# Patient Record
Sex: Female | Born: 1965 | Race: Black or African American | Hispanic: No | Marital: Married | Smoking: Never smoker
Health system: Southern US, Community
[De-identification: ages and names within clinical notes are randomized; demographics above are authoritative.]

## PROBLEM LIST (undated history)

## (undated) DIAGNOSIS — D7589 Other specified diseases of blood and blood-forming organs: Secondary | ICD-10-CM

## (undated) DIAGNOSIS — F1010 Alcohol abuse, uncomplicated: Secondary | ICD-10-CM

## (undated) DIAGNOSIS — R011 Cardiac murmur, unspecified: Secondary | ICD-10-CM

## (undated) DIAGNOSIS — M1990 Unspecified osteoarthritis, unspecified site: Secondary | ICD-10-CM

## (undated) DIAGNOSIS — D259 Leiomyoma of uterus, unspecified: Secondary | ICD-10-CM

## (undated) DIAGNOSIS — O3410 Maternal care for benign tumor of corpus uteri, unspecified trimester: Secondary | ICD-10-CM

## (undated) DIAGNOSIS — F419 Anxiety disorder, unspecified: Secondary | ICD-10-CM

## (undated) DIAGNOSIS — I10 Essential (primary) hypertension: Secondary | ICD-10-CM

## (undated) DIAGNOSIS — K589 Irritable bowel syndrome without diarrhea: Secondary | ICD-10-CM

## (undated) DIAGNOSIS — K219 Gastro-esophageal reflux disease without esophagitis: Secondary | ICD-10-CM

## (undated) DIAGNOSIS — E039 Hypothyroidism, unspecified: Secondary | ICD-10-CM

## (undated) DIAGNOSIS — E785 Hyperlipidemia, unspecified: Secondary | ICD-10-CM

## (undated) HISTORY — DX: Essential (primary) hypertension: I10

## (undated) HISTORY — DX: Gastro-esophageal reflux disease without esophagitis: K21.9

## (undated) HISTORY — DX: Maternal care for benign tumor of corpus uteri, unspecified trimester: O34.10

## (undated) HISTORY — DX: Irritable bowel syndrome, unspecified: K58.9

## (undated) HISTORY — PX: BREAST EXCISIONAL BIOPSY: SUR124

## (undated) HISTORY — PX: CHOLECYSTECTOMY: SHX55

## (undated) HISTORY — DX: Alcohol abuse, uncomplicated: F10.10

## (undated) HISTORY — DX: Hyperlipidemia, unspecified: E78.5

## (undated) HISTORY — DX: Other specified diseases of blood and blood-forming organs: D75.89

## (undated) HISTORY — PX: TUBAL LIGATION: SHX77

## (undated) HISTORY — DX: Unspecified osteoarthritis, unspecified site: M19.90

## (undated) HISTORY — PX: COLONOSCOPY: SHX174

## (undated) HISTORY — DX: Cardiac murmur, unspecified: R01.1

## (undated) HISTORY — DX: Hypothyroidism, unspecified: E03.9

## (undated) HISTORY — DX: Leiomyoma of uterus, unspecified: D25.9

## (undated) HISTORY — DX: Anxiety disorder, unspecified: F41.9

---

## 1997-11-25 ENCOUNTER — Encounter: Admission: RE | Admit: 1997-11-25 | Discharge: 1997-11-25 | Payer: Self-pay | Admitting: Internal Medicine

## 1997-11-30 ENCOUNTER — Emergency Department (HOSPITAL_COMMUNITY): Admission: EM | Admit: 1997-11-30 | Discharge: 1997-11-30 | Payer: Self-pay | Admitting: Emergency Medicine

## 1997-12-25 ENCOUNTER — Encounter: Admission: RE | Admit: 1997-12-25 | Discharge: 1997-12-25 | Payer: Self-pay | Admitting: Hematology and Oncology

## 1998-04-25 ENCOUNTER — Encounter: Payer: Self-pay | Admitting: Emergency Medicine

## 1998-04-25 ENCOUNTER — Emergency Department (HOSPITAL_COMMUNITY): Admission: EM | Admit: 1998-04-25 | Discharge: 1998-04-25 | Payer: Self-pay | Admitting: Emergency Medicine

## 1998-05-22 ENCOUNTER — Encounter: Admission: RE | Admit: 1998-05-22 | Discharge: 1998-05-22 | Payer: Self-pay | Admitting: Obstetrics

## 1998-11-06 ENCOUNTER — Other Ambulatory Visit: Admission: RE | Admit: 1998-11-06 | Discharge: 1998-11-06 | Payer: Self-pay | Admitting: *Deleted

## 1998-11-06 ENCOUNTER — Encounter: Admission: RE | Admit: 1998-11-06 | Discharge: 1998-11-06 | Payer: Self-pay | Admitting: Obstetrics

## 1999-01-01 ENCOUNTER — Encounter: Admission: RE | Admit: 1999-01-01 | Discharge: 1999-01-01 | Payer: Self-pay | Admitting: Obstetrics

## 1999-04-16 ENCOUNTER — Encounter: Admission: RE | Admit: 1999-04-16 | Discharge: 1999-04-16 | Payer: Self-pay | Admitting: Obstetrics

## 1999-05-26 ENCOUNTER — Encounter (INDEPENDENT_AMBULATORY_CARE_PROVIDER_SITE_OTHER): Payer: Self-pay

## 1999-05-26 ENCOUNTER — Inpatient Hospital Stay (HOSPITAL_COMMUNITY): Admission: AD | Admit: 1999-05-26 | Discharge: 1999-05-26 | Payer: Self-pay | Admitting: *Deleted

## 1999-05-27 ENCOUNTER — Inpatient Hospital Stay (HOSPITAL_COMMUNITY): Admission: RE | Admit: 1999-05-27 | Discharge: 1999-05-27 | Payer: Self-pay | Admitting: *Deleted

## 1999-12-15 ENCOUNTER — Inpatient Hospital Stay (HOSPITAL_COMMUNITY): Admission: AD | Admit: 1999-12-15 | Discharge: 1999-12-15 | Payer: Self-pay | Admitting: *Deleted

## 2000-01-06 ENCOUNTER — Other Ambulatory Visit: Admission: RE | Admit: 2000-01-06 | Discharge: 2000-01-06 | Payer: Self-pay | Admitting: Obstetrics and Gynecology

## 2000-03-10 ENCOUNTER — Encounter: Payer: Self-pay | Admitting: Obstetrics and Gynecology

## 2000-03-10 ENCOUNTER — Encounter (INDEPENDENT_AMBULATORY_CARE_PROVIDER_SITE_OTHER): Payer: Self-pay | Admitting: Specialist

## 2000-03-10 ENCOUNTER — Inpatient Hospital Stay (HOSPITAL_COMMUNITY): Admission: AD | Admit: 2000-03-10 | Discharge: 2000-03-12 | Payer: Self-pay | Admitting: Obstetrics and Gynecology

## 2000-03-18 ENCOUNTER — Encounter: Payer: Self-pay | Admitting: Obstetrics and Gynecology

## 2000-03-18 ENCOUNTER — Inpatient Hospital Stay (HOSPITAL_COMMUNITY): Admission: AD | Admit: 2000-03-18 | Discharge: 2000-03-21 | Payer: Self-pay | Admitting: Obstetrics and Gynecology

## 2000-03-19 ENCOUNTER — Encounter: Payer: Self-pay | Admitting: Obstetrics and Gynecology

## 2000-08-01 ENCOUNTER — Other Ambulatory Visit: Admission: RE | Admit: 2000-08-01 | Discharge: 2000-08-01 | Payer: Self-pay | Admitting: Obstetrics and Gynecology

## 2000-09-09 ENCOUNTER — Observation Stay (HOSPITAL_COMMUNITY): Admission: RE | Admit: 2000-09-09 | Discharge: 2000-09-09 | Payer: Self-pay | Admitting: Obstetrics and Gynecology

## 2000-11-26 ENCOUNTER — Encounter (INDEPENDENT_AMBULATORY_CARE_PROVIDER_SITE_OTHER): Payer: Self-pay | Admitting: Specialist

## 2000-11-26 ENCOUNTER — Encounter: Payer: Self-pay | Admitting: Obstetrics and Gynecology

## 2000-11-26 ENCOUNTER — Inpatient Hospital Stay (HOSPITAL_COMMUNITY): Admission: AD | Admit: 2000-11-26 | Discharge: 2000-12-18 | Payer: Self-pay | Admitting: Obstetrics and Gynecology

## 2000-12-16 ENCOUNTER — Encounter: Payer: Self-pay | Admitting: Obstetrics and Gynecology

## 2001-02-09 ENCOUNTER — Ambulatory Visit (HOSPITAL_COMMUNITY): Admission: RE | Admit: 2001-02-09 | Discharge: 2001-02-09 | Payer: Self-pay | Admitting: Obstetrics and Gynecology

## 2001-08-07 ENCOUNTER — Encounter: Admission: RE | Admit: 2001-08-07 | Discharge: 2001-08-07 | Payer: Self-pay | Admitting: Obstetrics and Gynecology

## 2001-08-07 ENCOUNTER — Encounter: Payer: Self-pay | Admitting: Obstetrics and Gynecology

## 2001-11-10 ENCOUNTER — Other Ambulatory Visit: Admission: RE | Admit: 2001-11-10 | Discharge: 2001-11-10 | Payer: Self-pay | Admitting: Obstetrics and Gynecology

## 2002-07-16 ENCOUNTER — Encounter: Payer: Self-pay | Admitting: Emergency Medicine

## 2002-07-16 ENCOUNTER — Inpatient Hospital Stay (HOSPITAL_COMMUNITY): Admission: EM | Admit: 2002-07-16 | Discharge: 2002-07-20 | Payer: Self-pay | Admitting: Emergency Medicine

## 2002-07-17 ENCOUNTER — Encounter: Payer: Self-pay | Admitting: Surgery

## 2002-07-18 ENCOUNTER — Encounter (INDEPENDENT_AMBULATORY_CARE_PROVIDER_SITE_OTHER): Payer: Self-pay | Admitting: *Deleted

## 2002-07-18 ENCOUNTER — Encounter: Payer: Self-pay | Admitting: General Surgery

## 2003-01-23 ENCOUNTER — Other Ambulatory Visit: Admission: RE | Admit: 2003-01-23 | Discharge: 2003-01-23 | Payer: Self-pay | Admitting: Obstetrics and Gynecology

## 2003-04-18 ENCOUNTER — Encounter (HOSPITAL_BASED_OUTPATIENT_CLINIC_OR_DEPARTMENT_OTHER): Payer: Self-pay | Admitting: General Surgery

## 2003-04-18 ENCOUNTER — Encounter: Admission: RE | Admit: 2003-04-18 | Discharge: 2003-04-18 | Payer: Self-pay | Admitting: General Surgery

## 2003-07-15 ENCOUNTER — Encounter: Admission: RE | Admit: 2003-07-15 | Discharge: 2003-07-15 | Payer: Self-pay | Admitting: General Surgery

## 2004-10-26 ENCOUNTER — Encounter: Admission: RE | Admit: 2004-10-26 | Discharge: 2004-10-26 | Payer: Self-pay | Admitting: Obstetrics and Gynecology

## 2005-02-15 ENCOUNTER — Other Ambulatory Visit: Admission: RE | Admit: 2005-02-15 | Discharge: 2005-02-15 | Payer: Self-pay | Admitting: Family Medicine

## 2006-02-07 ENCOUNTER — Other Ambulatory Visit: Admission: RE | Admit: 2006-02-07 | Discharge: 2006-02-07 | Payer: Self-pay | Admitting: Obstetrics and Gynecology

## 2006-02-15 ENCOUNTER — Encounter: Admission: RE | Admit: 2006-02-15 | Discharge: 2006-02-15 | Payer: Self-pay | Admitting: Obstetrics and Gynecology

## 2007-04-17 ENCOUNTER — Encounter: Admission: RE | Admit: 2007-04-17 | Discharge: 2007-04-17 | Payer: Self-pay | Admitting: Obstetrics and Gynecology

## 2007-04-19 ENCOUNTER — Encounter: Admission: RE | Admit: 2007-04-19 | Discharge: 2007-04-19 | Payer: Self-pay | Admitting: Obstetrics and Gynecology

## 2007-12-12 ENCOUNTER — Encounter: Admission: RE | Admit: 2007-12-12 | Discharge: 2007-12-12 | Payer: Self-pay | Admitting: Obstetrics and Gynecology

## 2008-04-18 ENCOUNTER — Encounter: Admission: RE | Admit: 2008-04-18 | Discharge: 2008-04-18 | Payer: Self-pay | Admitting: Obstetrics and Gynecology

## 2009-04-21 ENCOUNTER — Encounter: Admission: RE | Admit: 2009-04-21 | Discharge: 2009-04-21 | Payer: Self-pay | Admitting: Obstetrics and Gynecology

## 2009-09-09 ENCOUNTER — Emergency Department (HOSPITAL_COMMUNITY): Admission: EM | Admit: 2009-09-09 | Discharge: 2009-09-09 | Payer: Self-pay | Admitting: Emergency Medicine

## 2010-04-22 ENCOUNTER — Encounter: Admission: RE | Admit: 2010-04-22 | Discharge: 2010-04-22 | Payer: Self-pay | Admitting: Obstetrics and Gynecology

## 2010-07-28 ENCOUNTER — Encounter
Admission: RE | Admit: 2010-07-28 | Discharge: 2010-07-28 | Payer: Self-pay | Source: Home / Self Care | Attending: Internal Medicine | Admitting: Internal Medicine

## 2010-08-23 ENCOUNTER — Encounter: Payer: Self-pay | Admitting: Internal Medicine

## 2010-08-23 ENCOUNTER — Encounter: Payer: Self-pay | Admitting: Obstetrics and Gynecology

## 2010-08-23 ENCOUNTER — Encounter (HOSPITAL_BASED_OUTPATIENT_CLINIC_OR_DEPARTMENT_OTHER): Payer: Self-pay | Admitting: General Surgery

## 2010-09-09 ENCOUNTER — Encounter (INDEPENDENT_AMBULATORY_CARE_PROVIDER_SITE_OTHER): Payer: Self-pay | Admitting: *Deleted

## 2010-09-17 NOTE — Letter (Signed)
Summary: New Patient letter  [facility]  [address] [place], [place] [address]   Phone: [phone]  Fax: [phone]       09/09/2010 MRN: [id number]  Kayla Blake [address] [place], [place]  [address]  Dear Ms. Tirey,  Welcome to the Gastroenterology Division at [facility].    You are scheduled to see Dr.  Christella Hartigan on 10-16-10 at 11:00A.M. on the 3rd floor at [facility], [address]  We ask that you try to arrive at our office 15 minutes prior to your appointment time to allow for check-in.  We would like you to complete the enclosed self-administered evaluation form prior to your visit and bring it with you on the day of your appointment.  We will review it with you.  Also, please bring a complete list of all your medications or, if you prefer, bring the medication bottles and we will list them.  Please bring your insurance card so that we may make a copy of it.  If your insurance requires a referral to see a specialist, please bring your referral form from your primary care physician.  Co-payments are due at the time of your visit and may be paid by cash, check or credit card.     Your office visit will consist of a consult with your physician (includes a physical exam), any laboratory testing he/she may order, scheduling of any necessary diagnostic testing (e.g. x-ray, ultrasound, CT-scan), and scheduling of a procedure (e.g. Endoscopy, Colonoscopy) if required.  Please allow enough time on your schedule to allow for any/all of these possibilities.    If you cannot keep your appointment, please call [phone] to cancel or reschedule prior to your appointment date.  This allows [place] the opportunity to schedule an appointment for another patient in need of care.  If you do not cancel or reschedule by 5 p.m. the business day prior to your appointment date, you will be charged a $50.00 late cancellation/no-show fee.    Thank you for choosing  [facility] for your medical needs.  We appreciate the opportunity to care for you.  Please visit [place] at our website  to learn more about our practice.                     Sincerely,                                                             The Gastroenterology Division

## 2010-10-14 ENCOUNTER — Encounter (HOSPITAL_COMMUNITY)
Admission: RE | Admit: 2010-10-14 | Discharge: 2010-10-14 | Disposition: A | Discharge status: Home / Self Care | Payer: 59 | Source: Ambulatory Visit | Attending: Obstetrics and Gynecology | Admitting: Obstetrics and Gynecology

## 2010-10-14 LAB — CBC
Hemoglobin: 12.1 g/dL (ref 12.0–15.0)
MCH: 31.8 pg (ref 26.0–34.0)
MCHC: 33.4 g/dL (ref 30.0–36.0)
MCV: 95 fL (ref 78.0–100.0)

## 2010-10-14 LAB — BASIC METABOLIC PANEL
BUN: 7 mg/dL (ref 6–23)
CO2: 26 mEq/L (ref 19–32)
Calcium: 9.1 mg/dL (ref 8.4–10.5)
Creatinine, Ser: 0.76 mg/dL (ref 0.4–1.2)
GFR calc Af Amer: >60 mL/min (ref >60)
Glucose, Bld: 94 mg/dL (ref 70–99)

## 2010-10-16 ENCOUNTER — Ambulatory Visit: Payer: Self-pay | Admitting: Gastroenterology

## 2010-10-20 ENCOUNTER — Ambulatory Visit (HOSPITAL_COMMUNITY)
Admission: RE | Admit: 2010-10-20 | Discharge: 2010-10-20 | Disposition: A | Discharge status: Home / Self Care | Payer: 59 | Source: Ambulatory Visit | Attending: Obstetrics and Gynecology | Admitting: Obstetrics and Gynecology

## 2010-10-20 ENCOUNTER — Other Ambulatory Visit: Payer: Self-pay | Admitting: Obstetrics and Gynecology

## 2010-10-20 DIAGNOSIS — N920 Excessive and frequent menstruation with regular cycle: Secondary | ICD-10-CM | POA: Insufficient documentation

## 2010-10-20 DIAGNOSIS — D259 Leiomyoma of uterus, unspecified: Secondary | ICD-10-CM | POA: Insufficient documentation

## 2010-10-20 DIAGNOSIS — Z01818 Encounter for other preprocedural examination: Secondary | ICD-10-CM | POA: Insufficient documentation

## 2010-10-20 DIAGNOSIS — Z01812 Encounter for preprocedural laboratory examination: Secondary | ICD-10-CM | POA: Insufficient documentation

## 2010-10-29 NOTE — H&P (Signed)
NAMETYLICIA, Blake NO.:  [id number]  MEDICAL RECORD NO.:  [id number]           PATIENT TYPE:  O  LOCATION:  [facility]                           FACILITY:  [facility]  PHYSICIAN:  Osborn Coho, M.D.   DATE OF BIRTH:  07/12/1966  DATE OF ADMISSION:  10/14/2010 DATE OF DISCHARGE:                             HISTORY & PHYSICAL   HISTORY OF PRESENT ILLNESS:  [name] is a 45 year old married African American female, para 2-0-2-2 presenting for removal of Mirena IUD, hysteroscopy, D and C with endometrial ablation because of irregular vaginal bleeding.  The patient states that for the past 9 years she has had very irregular and heavy vaginal bleeding with her periods.  In particular, for the past 4 years her periods have been very heavy, lasting approximately 8 days and requiring her to change a pad every 30 minutes.  This bleeding would be accompanied by menstrual cramping that she rated as an 8/10 on a 10-point pain scale and minimally relieved with Midol.  Three years ago, the patient received the Mirena IUD in an effort to curtail her bleeding.  Upon initial insertion, she bled consistently for a month, then her bleeding decreased to 10 days per month, and finally ended with her having a 5- day flow every 2 weeks, but only had to change her pad every 2 hours. She denies any cramping with this bleeding.  She goes on to say that she has no dyspareunia, dysuria, changes in her bowel movements, or vaginitis symptoms.  In 2009, the patient had a pelvic ultrasound that showed the uterus measuring 10.10 cm x 7.39 cm x 7.20 cm with an anterior subserosal fibroid measuring 2.6 cm, a posterior intramural fibroid measuring 2.7 cm, and a posterior subserosal fibroid measuring 3.4 cm.  Both of the patient's ovaries appeared normal on that ultrasound.  She also had an endometrial biopsy done which did not reveal any hyperplasia, atypia, or malignancy.  In February 2012, the patient  had a normal complete blood count, TSH, prolactin, and vitamin D.  Due to the patient continuing to have irregular bleeding that is quite disruptive to her lifestyle,  she was given both medical and surgical management options.  After careful consideration, the patient has decided to proceed with removal of her Mirena IUD, hysteroscopy, D and C and endometrial ablation.  PAST MEDICAL HISTORY:  OB History:  Gravida 4, para 2-0-2-2.  The patient had two spontaneous vaginal births in 1987 and 2002.  GYN History:  Menarche 45 years old.  Last menstrual period on October 11, 2010.  She uses laparoscopic tubal cautery as her method of contraception.  The patient also has Mirena IUD in place for her bleeding.  She has a remote history of Chlamydia.  She has a history of having undergone colposcopy 5 years ago for an abnormal Pap smear, however, her Pap smears have been normal since that time, the most recent being February 2012.  Medical History: 1. Anemia. 2. Migraines. 3. Hypertension. 4. Thyroid disease. 5. Lactose intolerance. 6. Fibroids.  Surgical History:  2000 D&C, 2002 laparoscopic tubal cautery, 2002 cholecystectomy.  Denies any problems with anesthesia or history of blood transfusions.  FAMILY HISTORY:  Breast cancer, hypertension, tuberculosis, diabetes mellitus, and asthma.  SOCIAL HISTORY:  The patient is married and she works as a Child psychotherapist at [employer].  HABITS:  She denies any tobacco or illicit drug use.  She does occasionally consume alcohol.  CURRENT MEDICATIONS:  Iron daily, lisinopril (? dosage) daily, multivitamin daily, B complex vitamin daily, and levothyroxine (? dosage) daily.  ALLERGIES:  The patient has no known drug allergies and further denies any sensitivities to soy, latex, peanuts, or shellfish.  REVIEW OF SYSTEMS:  The patient does wear corrective lenses, admits to seasonal allergies, has cold chills at night, but denies  any hot flashes, nausea, vomiting, diarrhea, headache, vision changes, chest pain, shortness of breath, dysphagia, nasal congestion, chronic cough, back pain, myalgias, arthralgias, joint swelling, or skin rashes and except as mentioned in the history of present illness the patient's review of systems is otherwise negative.  PHYSICAL EXAMINATION:  VITAL SIGNS:  Blood pressure 122/70, pulse is 74, respirations 16, temperature 97.4 degrees Fahrenheit orally, weight 185 pounds, and height 5 feet and 6 inches tall.  Body mass index is 27. NECK:  Supple without masses.  There is no thyromegaly or cervical adenopathy. HEART:  Regular rate and rhythm. LUNGS:  Clear. BACK:  No CVA tenderness. ABDOMEN:  No tenderness, masses, guarding, rebound, or organomegaly. EXTREMITIES:  No clubbing, cyanosis, or edema. PELVIC:  EGBUS is normal.  Vagina is normal, though there is some blood in the vaginal vault.  Cervix is nontender without lesions.  IUD string is visible.  Uterus appears 12-14 weeks's size, retroverted without tenderness.  Adnexa without tenderness or separable masses.  IMPRESSION: 1. Irregular bleeding. 2. Uterine fibroids.  DISPOSITION:  A discussion was held with the patient regarding the indications for her procedures along with their risks which include but are not limited to reaction to anesthesia, damage to adjacent organs, infection, bleeding, and scarring.  The patient verbalized understanding of these risks and has consented to proceed with hysteroscopy, D and C followed by endometrial ablation once her Mirena IUD is removed at [facility] on October 20, 2010, at 12 noon.    Elmira J. Lowell Guitar, P.A.-C   ______________________________ Osborn Coho, M.D.   EJP/MEDQ  D:  10/14/2010  T:  10/15/2010  Job:  [id number]  Electronically Signed by Raylene Everts. on 10/15/2010 02:04:41 PM Electronically Signed by Osborn Coho M.D. on 10/29/2010 09:50:59 AM

## 2010-10-29 NOTE — Op Note (Signed)
  Kayla Blake, Kayla Blake               ACCOUNT NO.:  [id number]  MEDICAL RECORD NO.:  [id number]           PATIENT TYPE:  O  LOCATION:  [facility]                          FACILITY:  [facility]  PHYSICIAN:  Osborn Coho, M.D.   DATE OF BIRTH:  01-15-1966  DATE OF PROCEDURE:  10/20/2010 DATE OF DISCHARGE:                              OPERATIVE REPORT   PREOPERATIVE DIAGNOSES: 1. Menometrorrhagia. 2. Fibroids.  POSTOPERATIVE DIAGNOSES: 1. Menometrorrhagia. 2. Fibroids.  PROCEDURE: 1. Hysteroscopy. 2. Dilation and curettage. 3. Ablation via NovaSure.  ATTENDING:  Osborn Coho, MD  ANESTHESIA:  General via LMA.  SPECIMENS TO PATHOLOGY:  Endometrial curettings.  FLUIDS:  1000 mL  URINE OUTPUT:  Quantity sufficient via straight cath prior to procedure.  HYSTEROSCOPIC FLUID DEFICIT:  125 mL.  ESTIMATED BLOOD LOSS:  Minimal.  FINDINGS:  Small posterior wall fibroid, uterus sounded to 9 cm, cervical length measured 3.5 cm, cavity length measured 5.5 cm, cavity width was 4.5 cm.  Ablation was 46 seconds at a power of 136 watts.  COMPLICATIONS:  None.  PROCEDURE:  The patient was taken to the operating room after the risks, benefits, and alternatives discussed with the patient.  The patient verbalized understanding consent signed and witnessed.  The patient was placed under general anesthesia and prepped and draped in the normal sterile fashion in a dorsal lithotomy position.  A bivalve speculum placed in the patient's vagina, and the anterior lip of the cervix was grasped with single-tooth tenaculum.  A paracervical block was administered using a total of 10 mL of 1% lidocaine.  The cervix was then dilated for passage of the hysteroscope.  The uterus sounded to the findings as noted above.  The hysteroscope was introduced and findings as noted above.  The decision was made to resect however curettage was performed in order to see if there are any lesions that could be removed at  that time.  Once the resectoscope was introduced, there was too much bleeding to get good visualization in order to do the resection.  Decision was then made to proceed with the NovaSure.  The cervix was then dilated for passage of the NovaSure instrument.  The NovaSure was then introduced into the uterine cavity and findings as noted above.  Ablation was performed without difficulty and the NovaSure instrument was removed.  The diagnostic hysteroscope was reintroduced and adequate endometrial ablation results were noted.  All instruments were removed.  There was small amount of oozing at the right tenaculum site and pressure was applied.  Count was correct.  The patient tolerated procedure fairly well and is awaiting to be awakened by anesthesia and transferred to the recovery room.     Osborn Coho, M.D.     AR/MEDQ  D:  10/20/2010  T:  10/21/2010  Job:  [id number]  Electronically Signed by Osborn Coho M.D. on 10/29/2010 09:51:02 AM

## 2010-11-24 ENCOUNTER — Ambulatory Visit: Payer: Self-pay | Admitting: Gastroenterology

## 2010-11-24 ENCOUNTER — Telehealth: Payer: Self-pay | Admitting: Gastroenterology

## 2010-11-24 NOTE — Telephone Encounter (Signed)
Per Dr. Russella Dar will not charge patient.

## 2010-12-18 NOTE — Op Note (Signed)
[facility]  Patient:    Kayla Blake, Kayla Blake                      MRN: [id number] Proc. Date: 02/09/01 Adm. Date:  [id number] Attending:  Leonard Schwartz                           Operative Report  PREOPERATIVE DIAGNOSES:       1. Desires sterilization.                               2. Fibroid uterus.  POSTOPERATIVE DIAGNOSES:      1. Desires sterilization.                               2. Fibroid uterus.  PROCEDURE:                    Laparoscopic tubal cautery.  SURGEON:                      Janine Limbo, M.D.  ANESTHESIA:                   General.  DISPOSITION:                  [name] is a 45 year old female, para 1-1-2-2, who desires permanent sterilization. She understands the indications for her procedure and she accepts the risk of, but not limited to, anesthetic complications, bleeding, infections, possible damage to the surrounding organs, and possible tubal failure (10 to 17 per 1000).  FINDINGS:                     The uterus was approximately 10-week size and it contained subserosal fibroids. The fallopian tubes and ovaries were normal. The bowel, upper abdomen, and the remainder of the pelvic structures appeared normal.  DESCRIPTION OF PROCEDURE:     The patient was taken to the operating room where a general anesthetic was given. The patients abdomen, perineum, and vagina were prepped with multiple layers of Betadine. The bladder was drained of urine. A Hulka tenaculum was placed inside the cervix. The patient was sterilely draped. The subumbilical area was injected with 0.5% Marcaine. An incision was made in the subumbilical area and the Veress needle was inserted into the abdominal cavity without difficulty. Proper placement was confirmed using the saline drop test. A pneumoperitoneum was then obtained. The laparoscopic trocar and then the laparoscope were substituted for the Veress needle. The pelvic structures  were visualized with findings as mentioned above. The right fallopian tube was identified and followed to its fimbriated end. The right fallopian tube was cauterized in its proximal portion using the bipolar cautery. Several areas were cauterized. Hemostasis was adequate. An identical procedure was carried out on the opposite side. Again, hemostasis was adequate. The pelvis was again carefully inspected. There was no evidence of damage to the bowel or other vital structures. The pneumoperitoneum was allowed to escape. All instruments were removed. The subumbilical incision was closed using interrupted sutures of 4-0 Vicryl and 3-0 Vicryl. Sponge, needle, and instrument counts were correct on two occasions. The estimated blood loss was 3 cc.  FOLLOWUP INSTRUCTIONS:        The patient was given Vicodin one to  two p.o. q.4h. p.r.n. pain. She will return to see Dr. Stefano Gaul in two to three weeks for followup examination. She will call for questions or concerns. She was given a copy of the postoperative instruction sheet as prepared by the [facility] for patients who have undergone laparoscopy. DD:  02/09/01 TD:  02/09/01 Job: [id number]

## 2010-12-18 NOTE — Op Note (Signed)
NAME:  JANAVIA, ROTTMAN                         ACCOUNT NO.:  [id number]   MEDICAL RECORD NO.:  [id number]                   PATIENT TYPE:  INP   LOCATION:  5012                                 FACILITY:  [facility]   PHYSICIAN:  Sharlet Salina T. Hoxworth, M.D.          DATE OF BIRTH:  [birth year]   DATE OF PROCEDURE:  07/18/2002  DATE OF DISCHARGE:                                 OPERATIVE REPORT   PREOPERATIVE DIAGNOSIS:  Cholelithiasis and cholecystitis.   POSTOPERATIVE DIAGNOSIS:  Cholelithiasis and cholecystitis.   OPERATION PERFORMED:  Laparoscopic cholecystectomy with intraoperative  cholangiogram.   SURGEON:  Sharlet Salina T. Hoxworth, M.D.   ASSISTANT:  Gita Kudo, M.D.   ANESTHESIA:  General.   INDICATIONS FOR PROCEDURE:  The patient is a 45 year old black female who  presented with one week of abdominal pain, nausea and vomiting.  She  presented to the emergency room at [facility] where ultrasound  showed gallstones.  Liver function tests were elevated and she underwent  ERCP with stone extraction yesterday.  She continues to have some pain and  tenderness.  Laparoscopic cholecystectomy has been recommended and accepted.  The nature of the procedure, its indications and risks of bleeding,  infection, bile leak, bile duct injury and possible need for another  procedure were discussed and understood preoperatively.  She is now brought  to the operating room for this procedure.   DESCRIPTION OF PROCEDURE:  The patient was brought to the operating room and  placed in supine position on the operating table and general endotracheal  anesthesia was induced.  The abdomen was sterilely prepped and draped.  Local anesthesia was used to infiltrate the trocar sites.  A 1 cm incision  was made at the umbilicus and dissection was carried down to the midline  fascia which was sharply incised 1 cm.  The peritoneum was entered under  direct vision.  Through a mattress suture of 0  Vicryl, the Hasson trocar was  placed and pneumoperitoneum established.  Under vision a 10 mm trocar was  placed in the subxiphoid and two 5 mm trocars along the right subcostal  margin.  There was an obvious inflammatory process around the gallbladder in  the right upper quadrant.  Inflammatory omental adhesions were carefully  stripped down off the gallbladder which was severely subacutely inflamed.  It was aspirated with a needle aspirator with purulent material and pain.  This allowed the fundus to be grasped and elevated and further omental  adhesions were stripped down off the neck of the gallbladder and  infundibulum.  The infundibulum was able to be grasped and retracted  inferolaterally.  There was severe subacute inflammation that involved the  distal gallbladder and triangle of Calot.  Using careful blunt dissection  along the neck of the gallbladder working distally, the gallbladder was seen  to taper down.  The cystic duct appeared large at this point but was seen to  joint the gallbladder clearly and the common bile duct could be seen a  centimeter or two beyond this.  The anterior branch of the cystic artery was  identified and Calot's triangle was divided between clips.  The cystic duct  was further dissected and the distal gallbladder thoroughly dissected.  When  the anatomy was clear a cholangiogram was obtained through the cystic duct  that showed good filling of normal-sized common bile duct  and intrahepatic  ducts and at least a centimeter or so of cystic duct remaining.  The  catheter was then removed and the cystic duct doubly clipped proximally and  divided.  The gallbladder was then dissected free from its bed.  This  dissection was difficult and tedious due to severe scarring and inflammatory  change, acute and chronic along the posterior gallbladder wall.  In areas,  the gallbladder wall was necrotic and gangrenous.  After fairly long tedious  dissection, the  gallbladder was detached, placed in an EndoCatch bag and  removed through the umbilicus.  The right upper quadrant was irrigated with  approximately a liter of saline.  Small stones were removed and it was  irrigated until clear.  Surgicel was placed in the gallbladder bed.  Hemostasis appeared complete.  A closed suction drain was left on the  gallbladder bed in Morrison's pouch and brought out through one of the  lateral trocar sites.  Trocars were removed under direct vision and all CO2  evacuated from the peritoneal cavity.  The mattress sutures were secured at  the umbilicus.  The skin incisions were closed with interrupted subcuticular  4-0 Monocryl and Steri-Strips.  Sponge, needle and instrument counts were  correct.  Dry sterile dressing was applied.  The patient was taken to the  recovery room in good condition.                                                Lorne Skeens. Hoxworth, M.D.    Tory Emerald  D:  07/18/2002  T:  07/19/2002  Job:  [id number]

## 2010-12-18 NOTE — Discharge Summary (Signed)
[facility]  Patient:    Kayla Blake, Kayla Blake                      MRN: [id number] Adm. Date:  [id number] Disc. Date: 12/18/00 Attending:  Shaune Spittle Dictator:   Mack Guise, C.N.M.                           Discharge Summary  HISTORY:                      [name] is a 45 year old gravida 4, para 1-0-2-1 who presented to the hospital on November 26, 2000 at [redacted] weeks gestation with premature preterm rupture of membranes, clear fluid.  ADMITTING DIAGNOSES:          1. Intrauterine pregnancy at 27-1/7 weeks.                               2. Incompetent cervix.                               3. Preterm premature rupture of membranes.                               4. Chronic hypertension.  DISCHARGE DIAGNOSES:          1. Intrauterine pregnancy at 27-1/7 weeks.                               2. Incompetent cervix.                               3. Preterm premature rupture of membranes.                               4. Chronic hypertension.                               5. Preterm labor.                               6. Preterm delivery at [redacted] weeks gestation,                                  normal spontaneous vaginal delivery.  PROCEDURES:                   Normal spontaneous vaginal delivery.  HOSPITAL COURSE:              [name] has remained stable throughout with monitoring and blood work within normal range, on Dec 17, 2000 the patient began contracting and went into active labor. She proceeded onto a normal spontaneous vaginal delivery over an intact perineum with the birth of a 2-pound 11.6-ounce female infant, Gery Pray., with Apgar scores of 8 at one minute, 9 at five minutes. The baby is doing well initially and is in the NICU, and the patient has  done well in the postpartum period. On this, her first postpartum day, her hemoglobin is 10.2, and she is judged to be in satisfactory condition for  discharge.  DISCHARGE INSTRUCTIONS:       Discharge instructions are per [facility] handout.  DISCHARGE MEDICATIONS:        1. Motrin 600 mg p.o. q.6h. p.r.n. pain.                               2. Ortho Tri-Cyclen.                               3. Prenatal vitamins.  DISCHARGE FOLLOWUP:           Discharge followup will occur at [facility] in six weeks. DD:  12/18/00 TD:  12/18/00 Job: [id number]

## 2010-12-18 NOTE — Op Note (Signed)
[facility]  Patient:    Kayla Blake, Kayla Blake                      MRN: [id number] Proc. Date: 03/12/00 Adm. Date:  [id number] Disc. Date: [id number] Attending:  Shaune Spittle                           Operative Report  PREOPERATIVE DIAGNOSES:       1. [redacted] week gestation.                               2. Preterm rupture of membranes and preterm                                  delivery.                               3. Retained placenta.  POSTOPERATIVE DIAGNOSES:      1. [redacted] week gestation.                               2. Preterm rupture of membranes and preterm                                  delivery.                               3. Retained placenta.  PROCEDURE:                    Suction, dilatation, and curettage.  SURGEON:                      Janine Limbo, M.D.  ANESTHESIA:                   IV sedation.  DISPOSITION:                  [name] presented at [redacted] weeks gestation with ruptured membranes and preterm labor.  She delivered this morning at approximately 3:30 a.m.  She was unable to deliver the placenta.  She was febrile prior to her delivery.  The patient understands the indications for her procedure, and she accepts these risks of, but no limited to, anesthetic complications, bleeding, infection, and possible damage to surrounding organs.  FINDINGS:                     The placenta appeared normal.  DESCRIPTION OF PROCEDURE:     The patient was taken to the operating room where she was given medications through her IV line.  The perineum was prepped with Betadine, and the bladder was drained of urine.  The vagina was then prepped with Betadine, and a large portion of the placenta was removed using ream forceps.  The uterine cavity was then curetted using a size 10 suction curette and a medium-sharp curette until it was felt to be clean.  The patient tolerated the procedure well.  The estimated blood loss was 30  cc.  The patient was  taken to the recovery room in stable condition. DD:  03/12/00 TD:  03/13/00 Job: [id number]

## 2010-12-18 NOTE — Discharge Summary (Signed)
   NAME:  Kayla Blake, Kayla Blake                         ACCOUNT NO.:  [id number]   MEDICAL RECORD NO.:  [id number]                   PATIENT TYPE:  INP   LOCATION:  5012                                 FACILITY:  [facility]   PHYSICIAN:  Abigail Miyamoto, M.D.              DATE OF BIRTH:  01/27/1966   DATE OF ADMISSION:  07/16/2002  DATE OF DISCHARGE:  07/20/2002                                 DISCHARGE SUMMARY   DISCHARGE DIAGNOSIS:  Cholelithiasis and cholecystitis, status post  laparoscopic cholecystectomy and intraoperative cholangiogram.   HISTORY OF PRESENT ILLNESS:  The patient is a 45 year old female who  presented to the emergency department with six-day history of epigastric and  right upper quadrant pain with nausea and vomiting.  She was found to have  tenderness in right upper quadrant on examination and liver function tests  were elevated with bilirubin of 3.3, alkaline phosphatase 307, AST 279 and  ALT 230.  Ultrasound showed gallstones, thick walled gallbladder and dilated  common bile duct.   HOSPITAL COURSE:  The patient was admitted with diagnosis of cholecystitis  and possible choledocholithiasis and a gastroenterology consult was obtained  by Petra Kuba, M.D.  The following morning the patient went to the  endoscopy suite for an ERCP and was found to have common bile duct stone  which was removed.  She tolerated the procedure well and was taken in stable  condition back to the floor.  The following day, July 18, 2002, she was  taken to the operating room where she underwent a laparoscopic  cholecystectomy with intraoperative cholangiogram by Dr. Johna Sheriff and Dr.  Maryagnes Amos.  She tolerated this procedure well and again was taken back to the  floor and started on a diet.  Her liver function tests started to improve  and by July 20, 2002, she was doing well, was tolerating a diet.  Her  abdomen was soft.  The decision was made to discharge the patient home.   DISCHARGE DIAGNOSIS:  Cholecystitis with cholelithiasis as well as common  bile duct stones, status post laparoscopic cholecystectomy with  intraoperative cholangiogram and ERCP.   DISCHARGE MEDICATIONS:  Potassium  and Levaquin for seven days.  She will  take Vicodin and Tylenol for pain.   DISCHARGE INSTRUCTIONS:  She is to do no heavy lifting.  She may shower.   FOLLOW UP:  She will follow up with Dr. Johna Sheriff in two to three weeks of  discharge and she will follow up with Dr. Ewing Schlein p.r.n.                                               Abigail Miyamoto, M.D.    DB/MEDQ  D:  08/22/2002  T:  08/22/2002  Job:  [id number]

## 2010-12-18 NOTE — Consult Note (Signed)
NAME:  Kayla Blake, Kayla Blake                         ACCOUNT NO.:  [id number]   MEDICAL RECORD NO.:  [id number]                   PATIENT TYPE:  INP   LOCATION:  5012                                 FACILITY:  [facility]   PHYSICIAN:  Petra Kuba, M.D.                 DATE OF BIRTH:  February 10, 1966   DATE OF CONSULTATION:  07/16/2002  DATE OF DISCHARGE:                                   CONSULTATION   HISTORY OF PRESENT ILLNESS:  The patient was seen at the request of Dr.  Magnus Ivan for questionable CBD stones.  She has a weak history of abdominal  pain, nausea and vomiting.  She has been to [facility] twice.  She saw her family doctor who ordered an ultrasound for tomorrow, but when  the pain increased and she had more nausea and vomiting, she presented to  the emergency room where an ultrasound showed gallstones and now is admitted  for further work-up and plans.   PAST MEDICAL HISTORY:  This is pertinent for gestational diabetes and a  tubal ligation but no chronic medical problems.   MEDICATIONS:  Her only medicines have been Prevacid and Vicodin in the last  weekend given to her from the emergency room.   ALLERGIES:  No known drug allergies.   FAMILY HISTORY:  This is not significant for any obvious GI problems.   HABITS:  She does not smoke but does drink.  She rarely uses other over-the-  counter medicines.   REVIEW OF SYSTEMS:  Negative except above.   PHYSICAL EXAMINATION:  GENERAL APPEARANCE:  The patient is in no acute  distress, lying comfortably in the bed.  VITAL SIGNS:  Temperature in the ER was 100.4, now 98.0.  She is feeling  better.  Vital signs stable.  ABDOMEN:  There is some upper tenderness, right greater than left.  There is  minimal guarding, no rebound.  Decreased but present bowel sounds.  Nontender lower quadrant.   LABORATORY DATA:  Labs reviewed and were pertinent for a white count of  11.5, hemoglobin 11, platelet count 232.  BUN and  creatinine normal.  Potassium 2.4.  Normal amylase and lipase.  Total bilirubin 3.3, alkaline  phosphatase 307, AST 279 and ALT 230.   Ultrasound shows a 6.6 mm CBD with gallstones and thick wall.   ASSESSMENT:  Gallstones, elevated liver test, probably common bile duct  stones.   PLAN:  The risks, benefits, methods of ERCP with sphincterotomy, stone  extraction versus laparoscopic cholecystectomy in trying to get the stones  out, versus an open cholecystectomy were discussed with the patient.  I  expect I will need to do the ERCP first; however, I will wait on Dr.  Eliberto Ivory examination in the a.m. and repeat  liver tests to decide.  Certainly if the patient is feeling better and liver  tests have dropped significantly, I would be happy  to proceed after  intraoperative cholangiogram and will discuss in the a.m. with the patient  and Dr. Magnus Ivan.   Thank you very much for the consultation.                                               Petra Kuba, M.D.    MEM/MEDQ  D:  07/16/2002  T:  07/17/2002  Job:  [id number]   cc:   Schuyler Amor, M.D.  [address]  [place], [place] [address]  Fax: [phone]

## 2010-12-18 NOTE — H&P (Signed)
[facility]  Patient:    Kayla Blake, Kayla Blake                        MRN: [id number] Attending:  Maris Berger. Pennie Rushing, M.D. Dictator:   Miguel Dibble, C.N.M.                         History and Physical  DATE OF BIRTH:                09-03-1965.  HISTORY OF PRESENT ILLNESS:   This is a 45 year old, gravida 3, para 1-0-1-1, at 19 weeks, who presented today in the office with bulging bag of waters almost to the introitus, complaints of spotting for the last three days, and mild cramps over the last week that have only been abated slightly by ibuprofen.  She was admitted in Trendelenburg position, transferred by EMS. She has had a pregnancy which has been at risk secondary to advanced maternal age, history of fibroids, trichomonas in the first trimester, and a previous history in her previous pregnancy of a rapid labor of only two hours in 1987.  PRENATAL LABORATORY DATA:     At entry into the practice; hemoglobin 11.5, hematocrit 35.8, platelets 256.  Blood type and rh B positive, rh antibodies negative, sickle cell trait negative, VDRL nonreactive, toxoplasmosis negative, hepatitis negative, Pap smear within normal limits except for Trichomonas infection.  PAST MEDICAL HISTORY:         Diagnosed with six small fibroids in year 2000. History of UTIs in the past.  Anemia during the past.  FAMILY HISTORY:               Maternal grandmother with heart disease. Mother, maternal grandmother, and great grandmother with hypertension. Maternal uncle with tuberculosis.  Mother, maternal grandmother, and great grandmother with insulin-dependent diabetes mellitus.  Maternal great grandmother with breast cancer.  GENETIC HISTORY:              Significant for being age 15 years old and uncle and cousin of the patient with sickle cell disease.  PAST OBSTETRIC HISTORY:       In March of 1987 normal spontaneous vaginal delivery of a viable babygirl weighing 6 pounds  13 ounces at 41 weeks after two hours of labor.  November of 2000, spontaneous abortion followed by a D&C at eight weeks.  This is third pregnancy.  SOCIAL HISTORY:               African-American married to [employer], Methodist religion.  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  Father of the baby is a Proofreader, high school graduate, working fulltime.  Denies smoking, alcohol, or drug abuse.  PHYSICAL EXAMINATION:  HEENT:                        Within normal limits.  LUNGS:                        Bilaterally clear.  HEART:                        Regular rate and rhythm.  ABDOMEN:                      Soft and nontender.  Complains of pelvic pressure and cramps.  PELVIC:  Cervix unable to safely assess by digital examination.  Bulging bag of waters almost to the introitus.  EXTREMITIES:                  Negative edema.  DTRs +1.  ASSESSMENT:                   1. 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                               2. Bulging bag of waters.                               3. Previous history of rapid or precipitous                                  labor.                               4. First trimester Trichomonas, treated.                               5. Fibroids.                               6. Advanced maternal age.  PLAN:                         Admit to [facility]. Trendelenburg position.  Management by Maris Berger. Pennie Rushing, M.D.  Stat OB ultrasound.  Emotional support and grief support given to patient who is crying and distraught at the moment. DD:  03/10/00 TD:  03/10/00 Job: [id number]

## 2010-12-18 NOTE — Discharge Summary (Signed)
[facility]  Patient:    Kayla Blake, Kayla Blake                      MRN: [id number] Adm. Date:  [id number] Disc. Date: [id number] Attending:  Shaune Spittle Dictator:   Miguel Dibble, C.N.M.                           Discharge Summary  DATE OF BIRTH:                11/02/1965  ADMISSION DIAGNOSES:          1. Intrauterine pregnancy at 19 weeks with                                  incompetent cervix and bulging bag of water.                               2. Premature dilation.  DISCHARGE DIAGNOSES:          1. Intrauterine pregnancy at 19 weeks with                                  incompetent cervix and bulging bag of water.                               2. Premature dilation.                               3. Delivered nonviable female weighing 200 g,                                  Apgars 0/0, spontaneously.  Retained                                  placenta.                               4. Recovered satisfactorily from dilatation and                                  curettage.  PROCEDURES:                   1. Magnesium sulfate tocolysis.                               2. Electronic fetal monitoring.                               3. IV antibiotic therapy.                               4. Adult intensive care unit monitoring.  5. Dilatation and curettage.                               6. MAC.                               7. Pitocin induction.  LABOR/DELIVERY:               Onset of labor at approximately 3 a.m. and delivery of the infant on August 11, 3:06 a.m.  COURSE OF HOSPITALIZATION:    On August 9, Kayla Blake presented in the office for an exam with complaints of light spotting and was found to be completely dilated with bulging bag of water into the vaginal vault.  She was transferred by EMS to the hospital, remained in Trendelenburg position, was given terbutaline for tocolysis and magnesium  sulfate for tocolysis. Hemoglobin 11.2, white count 7.6. Chemistry labs were within normal limits. Fetal heart tones were in the 150s.  Her magnesium level was 3.3.  Discussions were carried on at length regarding options.  The patient would desire rescue cerclage if at all possible.  The opportunity for that did not present itself. She went on on August 10 at approximately 1:55 p.m. to rupture membranes spontaneously.  Her cervix was fingertip and 75% effaced.  Abdomen was nontender, and she was afebrile at the time.  By 3:50 a.m., she had developed a fever and agree to induce labor with Pitocin.  She delivered a fetus of appropriate size without difficulty and no growth anomalies.  By 6:53 a.m., she still had not passed the placenta in spite of gentle traction and Pitocin. A D&C was performed, and retained placenta was evacuated without difficulty. Estimated blood loss 30 cc.  She was recovering satisfactorily postpartum in the ICU with small amount of bleeding.  Vital signs were stable.  She remained afebrile following her D&C.  She was grieving appropriately with adequate family support.  She received a visit from the comfort team and the chaplain. She was discharged home in stable condition after supper.  DISCHARGE INSTRUCTIONS:       She is to call if she has a fever above 100, severe pain, heavy bleeding, or any other complication.  No intercourse for four weeks or at least while bleeding.  Nothing in the vagina including tampons.  FOLLOWUP:                     Discharge followup in two weeks at [facility].  DISCHARGE MEDICATIONS:        1. Prenatal vitamins to continued for at least                                  two months.                               2. Motrin 600 mg q.6h. p.r.n.                               3. Ortho Tri-Cyclen birth control pills to start  in two weeks. DD:  03/12/00 TD:  03/14/00 Job: [id number]

## 2010-12-18 NOTE — Op Note (Signed)
[facility]  Patient:    Kayla Blake, Kayla Blake                      MRN: [id number] Proc. Date: 09/09/00 Adm. Date:  [id number] Disc. Date: [id number] Attending:  Leonard Schwartz                           Operative Report  PREOPERATIVE DIAGNOSES:       1. A 16-week gestation.                               2. Incompetent cervix.  POSTOPERATIVE DIAGNOSES:      1. A 16-week gestation.                               2. Incompetent cervix.  PROCEDURE:                    [employer] cerclage.  SURGEON:                      Janine Limbo, M.D.  ANESTHESIA:                   Spinal.  DISPOSITION:                  [name] is a 45 year old female, gravida 4, para 1-0-2-1, who presents at [redacted] weeks gestation (EDC is February 25, 2001). The patient has an incompetent cervix as determined by the fact that she has had a 19-week pregnancy loss with silent dilatation of the cervix. The patient understands the indications for her procedure and she accepts the risks of, but not limited to, anesthetic complications, bleeding, infections, and possible damage to the surrounding organs which may result in significant bleeding and/or miscarriage.  FINDINGS:                     The patients blood type is B positive. The patients cervix was long and closed.  ESTIMATED BLOOD LOSS:         Between 20 and 30 cc.  DESCRIPTION OF PROCEDURE:     The patient was taken to the operating room where a spinal anesthetic was given. The patient was placed in a lithotomy position. The perineum and vagina were prepped with multiple layers of Betadine. A Foley catheter was placed in the bladder. Examination under anesthesia was performed. The patient was sterilely draped. A weighted speculum was placed in the posterior vagina. A stitch of #5 Mersilene was placed in a circumferential fashion around the cervix at the level of the internal os. The knot was tied in the posterior vagina.  The estimated blood loss was between 20 and 30 cc. At the end of our procedure, there was no evidence of continued bleeding or leakage of fluid. The patient tolerated her procedure well. She was taken to the recovery room in stable condition.  FOLLOWUP INSTRUCTIONS:        The patient will remain in the hospital for six hours for observation. She will take Ibuprofen 600 mg p.o. q.6h. at home. She was given Vicodin, one to two p.o. q.4h., to take as needed for pain. She will remain largely on bed rest for a week followed by limited ambulation for another week.  She will return in one week for followup examination. If at any time she needs our services, she is to call. DD:  09/09/00 TD:  09/10/00 Job: [id number]

## 2010-12-18 NOTE — H&P (Signed)
[facility] of [facility]  Patient:    Kayla Blake, Kayla Blake                      MRN: [id number] Adm. Date:  [id number] Attending:  Shaune Spittle Dictator:   Nigel Bridgeman, C.N.M.                         History and Physical  HISTORY OF PRESENT ILLNESS:   Kayla Blake is a 45 year old gravida 3, para 1-0-2-1, who presented from the office on March 18, 2000, for evaluation secondary to elevated blood pressure, right lower quadrant and flank pain and proteinuria.  The patients history was remarkable for being status post a 19-week fetal loss and a D&C August 11, for retained placenta.  The pregnancy was remarkable for: 1. Incompetent cervix.  2. Delivery of a nonviable fetus. 3. D&C for retained placenta.  4. Advanced maternal age.  5. Fibroids. 6. Trichomonas first trimester.  On admission to maternity admissions, the patients blood pressure was 207/105 and remained elevated.  There was mild positive CVA tenderness and proteinuria on catheterized specimen.  In maternity admissions unit the patient was evaluated with a CBC which was within normal limits; a differential which was normal; a comprehensive metabolic which was normal; amylase normal; a catheterized UA showed large blood, specific gravity of 1.025 and protein greater than 300 mg.  Pelvic ultrasound showed multiple fibroids, no adnexal masses and no definite retained products of conception.  The patient was then admitted for consultation and care by Dr. Arlean Hopping and further evaluation of her elevated blood pressure.  PAST MEDICAL HISTORY:         Remarkable for fibroids, UTIs and questionable anemia.  OBSTETRICAL HISTORY:          The patient is a gravida 3, para 1-0-2-1.  She had a miscarriage at 8 weeks in November 2000, spontaneous loss at 19 weeks on March 11, 2000, and a normal intrauterine pregnancy and delivery in 1987.  SOCIAL HISTORY:               The patient is married; works in Financial trader  care and denies any alcohol, drug or tobacco use.  FAMILY HISTORY:               Remarkable for mother with pregnancy-induced hypertension; maternal great-grandmother with heart disease; mother, maternal grandmother, maternal great-grandmother all with hypertension.  The patients sister is anemic.  Her maternal uncle has asthma.  Her mother and maternal grandmother had insulin-dependent diabetes and are now deceased, her maternal great-grandmother also.  Her maternal great-grandmother had breast cancer. The patients maternal uncle and cousin have sickle cell disease.  PHYSICAL EXAMINATION:  VITAL SIGNS:                  Blood pressure is 208/106, now down to 163/90 after Procardia.  HEENT:                        Within normal limits.  LUNGS:                        Bilateral breath sounds are clear.  HEART:                        Regular rate and rhythm without murmur.  BREASTS:  Soft and nontender.  ABDOMEN:                      Soft, nontender, negative rebound.  BACK:                         Slight left CVA tenderness.  EXTREMITIES:                  Negative edema.  NEUROLOGICAL EXAM:            Per Dr. Arlean Hopping alert and oriented x 3.  IMPRESSION:                   1. One-week postpartum with right lower quadrant                                  pain.                               2. Elevated blood pressures of unknown etiology.                               3. Proteinuria.  PLAN:                         The patient is to be admitted to the third floor for primary care by Dr. Arlean Hopping practice.  [facility] will follow as well.  DR. Arlean Hopping PLAN: 1. Procardia p.r.n. 2. A 24-hour urine protein. 3. ANA, C3, C4, ACLA and LAC, ANCA, and antibody screens. 4. Renal ultrasound. 5. Daily BUN and creatinine. 6. Heparin lock IV. 7. Check LDH. 8. Check smear to schizocytes. 9. Will use Darvocet, Tylenol or morphine sulfate for pain since NSAIDs  will    not be used. DD:  03/21/00 TD:  03/21/00 Job: [id number]

## 2010-12-18 NOTE — H&P (Signed)
[facility] of [facility]  Patient:    Kayla Blake, Kayla Blake                      MRN: [id number] Adm. Date:  [id number] Disc. Date: [id number] Attending:  Shaune Spittle                         History and Physical  HISTORY OF PRESENT ILLNESS:  Kayla Blake is a 45 year old female, para 1-1-2-2, who presents for sterilization.  She has a history of fibroids.  She has had a chlamydia and a trichomoniasis infection in the past.  She has had a D&C in the past.  OB HISTORY:  The patient had a term vaginal delivery in 1987.  She had a miscarriage in 2000.  She had a 19 week pregnancy loss in 2001.  She had a preterm vaginal delivery of a female infant that is now doing well in 2002.  PAST MEDICAL HISTORY:  The patient had depression following her 19 week pregnancy loss.  She is doing very well at this time.  The patient had hypertension with a prior pregnancy.  There was a question of renal disease following that pregnancy but the patient is currently doing well.  SOCIAL HISTORY: The patient drinks beer socially.  She denies cigarette use and recreational drug use.  DRUG ALLERGIES:  No known drug allergies.  REVIEW OF SYSTEMS:  Noncontributory.  FAMILY HISTORY:  Noncontributory.  PHYSICAL EXAMINATION:  VITAL SIGNS:  Weight is 184 pounds.  HEENT:  Within normal limits .  CHEST:  Clear.  HEART:  Regular rate and rhythm.  BREASTS:  Without masses.  ABDOMEN:  Nontender.  EXTREMITIES:  Within normal limits.  NEUROLOGIC:  Normal.  PELVIC EXAM:  External genitalia is normal.  Vagina is normal.  Cervix is nontender.   Uterus is normal size, shape, and consistency.   Adnexa:  No masses.  ASSESSMENT:  Desires sterilization.  PLAN:  The patient will undergo a laparoscopic tubal cautery.  She understands the indications for her procedure and she accepts the risk of, but not limited to anesthetic complications, bleeding, infections, possible damage to  the surrounding organs and possible tubal failure (10-17 per 1000). DD:  02/08/01 TD:  02/08/01 Job: [id number]

## 2010-12-18 NOTE — H&P (Signed)
[facility]  Patient:    Kayla Blake, Kayla Blake                      MRN: [id number] Adm. Date:  [id number] Attending:  Shaune Spittle Dictator:   Philipp Deputy, C.N.M.                         History and Physical                                [name] is a 45 year old gravida 4, para 1-0-2-1 at 52 and 1/[redacted] weeks EGA who presents with a gush of fluid from her vagina at approximately 2:30 this afternoon.  She denies cramping or uterine contractions or vaginal bleeding.  She reports positive fetal movement.  She had a cerclage placed at approximately [redacted] weeks gestation due to a preterm delivery at 19 weeks with her third pregnancy but she has had no preterm labor with this pregnancy so far.  Her pregnancy has been followed by the [facility] OB/GYN M.D. service and has been complicated by advanced maternal age, history of multiple fibroids, history of incompetent cervix and rapid labor, second trimester loss, history of postpartum TTPUS, chronic hypertension.  PRENATAL LABORATORIES:        Blood type B+.  Antibody negative.  Sickle cell trait negative.  RPR nonreactive.  Rubella immune.  Hepatitis B surface antigen negative.  HIV nonreactive.  Pap smear within normal limits. Gonorrhea and chlamydia cultures negative.  Her toxoplasmosis laboratory work was negative.  Her one hour glucose challenge was 132.  She declined the maternal serum alpha fetoprotein screening.  Hemoglobin and hematocrit were 11.8 and 35.5.  She had a negative urine culture on January 28 at approximately [redacted] weeks gestation.  HISTORY OF PRESENT PREGNANCY: [name] presented for care at approximately [redacted] weeks gestation.  At that visit she had bacterial vaginosis and was treated with Flagyl beginning at [redacted] weeks gestation x 1 week.  She had an ultrasound at about 13 1/2 weeks which showed fibroids, the largest being in the fundal area at about 3 cm in size.  At about [redacted] weeks  gestation she had an incidence of low back pain and lower abdominal tenderness.  Her cervix was closed at that time and she had a negative urine culture.  Soon after that visit she had a McDonald cerclage placed by Dr. Stefano Gaul.  PAST OBSTETRICAL HISTORY:     In 1987 she delivered a female infant vaginally after two hours of labor at approximately [redacted] weeks gestation.  The childs name was Nanetta Batty and weighed 6 pounds 13 ounces at birth.  In November 2000 she had a spontaneous abortion at approximately 8 weeks which required a D&C. In August 2001 she delivered a nonviable infant at approximately [redacted] weeks gestation vaginally.  With that pregnancy she was diagnosed with Trichomonas in the first trimester.  PAST MEDICAL HISTORY:         She had anemia with the first pregnancy.  She reports having had depression following her 19 week delivery of her third pregnancy for which she was given Paxil, but did not take.  She had a colposcopy procedure done in 1998 with normal Pap smears since that time.  She has a history of multiple fibroids.  She had chlamydia in 1998.  She reports having had the usual childhood  illnesses.  She was diagnosed with hypertension following her last pregnancy and also was diagnosed with postpartum TTPUS after her last pregnancy.  She had a urinary tract infection in 1988.  PAST SURGICAL HISTORY:        Negative.  FAMILY HISTORY:               Maternal grandfather and maternal grandmother with heart disease, mother and maternal grandmother with hypertension.  She has a maternal uncle with tuberculosis.  Mother and maternal grandmother with insulin-dependent diabetes mellitus, both deceased.  Multiple family members use alcohol and tobacco.  GENETIC HISTORY:              Patient is of advanced maternal age at the age of 73.  Patient has maternal uncle and cousin with sickle cell disease.  SOCIAL HISTORY:               She is married to [employer].  They are  of the [employer].  They are high school educated and employed full time. They deny any alcohol, smoking, or illicit drug use since the positive pregnancy test.  ALLERGIES:                    None.  PHYSICAL EXAMINATION  VITAL SIGNS:                  Afebrile.  Vital signs are stable.  HEENT:                        Within normal limits.  LUNGS:                        Clear to auscultation.  HEART:                        Regular to rate and rhythm.  BREASTS:                      Soft and nontender.  ABDOMEN:                      Gravid, soft, and nontender.  Electronic fetal monitoring:  No contractions per monitor with a reassuring fetal heart rate, mild variables, and few accelerations.  PELVIC:                       Cervix is funneled to fingertip, thick, positive Nitrazine, positive ferning.  EXTREMITIES:                  Within normal limits.  LABORATORIES:                 Wet prep result is moderate white blood cells.  ASSESSMENT:                   1. Intrauterine pregnancy at 27 weeks.                               2. Preterm rupture of membranes.  PLAN:                         1. Admit to antenatal for consult with Dr.  Haygood.                               2. Strict bed rest.                               3. Plan betamethasone.                               4. Penicillin prophylaxis.                               5. Urine gonorrhea, chlamydia, and group B strep                                  cultures collected. DD:  11/26/00 TD:  11/27/00 Job: [id number]

## 2010-12-18 NOTE — Consult Note (Signed)
[place]  PatientLAKIESHA, RALPHS                         MRN: [id number] Attending:  Roney Jaffe, M.D. CC:         Seymour Bars. Leo Grosser, M.D.   Consultation Report  REASON FOR CONSULTATION:  Elevated creatinine and blood pressure.  HISTORY:  The patient is a pleasant 45 year old African-American female who presents with mild right lower quadrant pain one week after spontaneous abortion of 18-week intrauterine pregnancy and who was found to have new onset of high blood pressure, 200/108, with proteinuria and hematuria and urinalysis and an elevated creatinine at 1.6.  The patient was having a normal pregnancy up to 18 to 19 weeks; she then presented on August 9th, just over one week ago, with bleeding and cramping and was found to have dilated cervix with bulging membranes.  She was admitted for tocolysis and then delivered a nonviable fetus the following day on the 10th.  A D&C was performed on the 11th for retained placenta.  Patient was discharged later that day in stable condition.  Blood pressure at the time of discharge was 116/70.  Other labs during the hospitalization one week ago were creatinine of 0.6, albumin 3.3, hematocrit 33% and platelets 209,000.  She was seen two days ago in the clinic for chest discomfort and it was thought to be musculoskeletal and took one muscle relaxant pill.  She took a nonsteroidal agent, probably ibuprofen, yesterday and again today but has not had any recent heavy use of nonsteroidals.  She then awoke this morning with some right lower quadrant and flank pain after a bowel movement.  She did not have any nausea, vomiting, diarrhea, visual symptoms, urinary changes, dysuria or other complaints.  She did have a small-to-moderate amount of vaginal bleeding this week.  On presentation, she was found to have an elevated blood pressure of 170/100 and other lab abnormalities as noted above.  She has  been given one dose of Procardia 20 mg, with decrease in blood pressure to 160/90.  PAST MEDICAL HISTORY:  Fibroids, UTIs in the past and a questionable history of anemia.  OBSTETRIC HISTORY:  She has had three pregnancies, normal pregnancy and delivery in 1987, a miscarriage at 8 weeks in November of 2000 and spontaneous abortion, as above, at 18 weeks, in August of this year.  SOCIAL HISTORY:  Patient is married and works in [employer] for [employer].  No tobacco, alcohol or illicit drug use.  She lives with her husband and daughter in [place], [place].  REVIEW OF SYSTEMS:  Noncontributory.  PHYSICAL EXAMINATION  VITAL SIGNS:  Blood pressure initially 208/106, currently 160/90.  Heart rate 80.  Respiratory rate 16.  Temperature 97.4.  GENERAL:  Patient is nontoxic and in no acute distress.  SKIN:  Without rash, purpura or livedo reticularis.  HEENT:  PERRL.  EOMI.  Fundi are normal with sharp disk margins.  NECK:  No bruits.  CHEST:  Clear throughout.  CARDIAC:  Regular rate and rhythm with a 2/6 systolic ejection murmur at the right upper sternal border without radiation to the neck.  ABDOMEN:  Soft, nontender, without organomegaly or masses.  There is minimal tenderness in the right lower quadrant.  EXTREMITIES:  No edema.  NEUROLOGIC:  Alert and oriented x 3.  LABORATORY FINDINGS:  White blood count 13.6, hematocrit 34%, platelets 215,000.  Sodium 142, potassium 3.8, BUN 13,  creatinine 1.6.  Total bilirubin 0.7, SGPT 40, SGPT 38, alkaline phosphatase 80, amylase 68, albumin 3.4, calcium 9.1.  Urinalysis:  Specific gravity 1.025, negative LE and nitrite, greater than 300 protein and large blood, wbcs less than 5 per high power field, rbcs 10-20 per high power field.  I saw occasional granular casts with suggestion of incorporated cellular elements in some of the casts.  No overt red blood cell casts were seen.  IMPRESSION:  New-onset hypertension, proteinuria  and hematuria as well as elevated creatinine in patient one week post spontaneous abortion at 18 weeks. There is no gross evidence for hemolysis at this point or thrombocytopenia; however, the main differential diagnosis remained postpartum thrombotic thrombocytopenic purpura/hemolytic uremic syndrome versus an unusually early presentation of preeclampsia.  Differentiating the two can often be difficult without a biopsy.  Underlying glomerulonephritis seems unlikely, given monthly urinalysis dipsticks which have been negative, according to the patient.  Also consider acute glomerulonephritis related to lupus and/or antiphospolipid antibody syndrome in this young black female with a history of miscarriage last year, although there are no systemic signs or symptoms of lupus, per se.  RECOMMENDATIONS 1. Control blood pressure; Procardia is fine for this.  She may need a    diuretic additionally if blood pressure is difficult to control, although I    do not anticipate it being difficult to control. 2. Twenty-four-hour urine for protein quantitation. 3. Serologies including ANA, ANCA, anti-GBM antibody, anticardiolipin    antibody, lupus anticoagulant, C3 and C4. 4. Renal ultrasound. 5. Daily BUN and creatinine. 6. No need for IV fluids. 7. Follow LDH and platelet count as well as smear for shistocytes. 8. If this is preeclampsia, we should see resolution fairly promptly.  If    there is underlying acute GN or TTP/HUS, they may be worsening and renal    biopsy may be necessary. DD:  03/18/00 TD:  03/20/00 Job: [id number]

## 2011-01-05 ENCOUNTER — Ambulatory Visit: Payer: 59 | Admitting: Gastroenterology

## 2011-02-10 ENCOUNTER — Ambulatory Visit: Payer: 59 | Admitting: Gastroenterology

## 2011-03-29 ENCOUNTER — Other Ambulatory Visit: Payer: Self-pay | Admitting: Family Medicine

## 2011-03-29 DIAGNOSIS — Z1231 Encounter for screening mammogram for malignant neoplasm of breast: Secondary | ICD-10-CM

## 2011-03-31 ENCOUNTER — Encounter: Payer: Self-pay | Admitting: Gastroenterology

## 2011-03-31 ENCOUNTER — Ambulatory Visit: Payer: 59 | Admitting: Gastroenterology

## 2011-03-31 ENCOUNTER — Ambulatory Visit (INDEPENDENT_AMBULATORY_CARE_PROVIDER_SITE_OTHER): Payer: 59 | Admitting: Gastroenterology

## 2011-03-31 VITALS — BP 122/74 | HR 68 | Ht 66.0 in | Wt 186.0 lb

## 2011-03-31 DIAGNOSIS — D509 Iron deficiency anemia, unspecified: Secondary | ICD-10-CM

## 2011-03-31 DIAGNOSIS — R195 Other fecal abnormalities: Secondary | ICD-10-CM

## 2011-03-31 MED ORDER — NA SULFATE-K SULFATE-MG SULF 17.5-3.13-1.6 GM/177ML PO SOLN: 1.0000 | ORAL | Status: DC

## 2011-03-31 NOTE — Progress Notes (Signed)
History of Present Illness: This is a 45 year old female who was recently found a mild iron deficiency anemia with a slightly low hematocrit at 35.2 (normal above 360 is slightly low iron saturation of 19%. Hemoccult-positive stool two out of three specimens. Blood work and office notes from Dr. Revonda Humphrey office were reviewed. Denies weight loss, abdominal pain, constipation, diarrhea, change in stool caliber, melena, hematochezia, nausea, vomiting, dysphagia, reflux symptoms, chest pain.  Past Medical History  Diagnosis Date  . Alcohol abuse   . Allergic rhinitis   . Heart murmur   . Hypertension   . Uterine fibroids affecting pregnancy   . Hypothyroidism   . Vitamin D deficiency   . Anemia    Past Surgical History  Procedure Date  . Cholecystectomy   . Tubal ligation     reports that she has never smoked. She has never used smokeless tobacco. She reports that she drinks alcohol. She reports that she does not use illicit drugs. family history includes Breast cancer in her sister.  There is no history of Colon cancer. No Known Allergies  Outpatient Encounter Prescriptions as of 03/31/2011  Medication Sig Dispense Refill  . lisinopril (PRINIVIL,ZESTRIL) 5 MG tablet Take 5 mg by mouth daily.        . Na Sulfate-K Sulfate-Mg Sulf (SUPREP BOWEL PREP) SOLN Take 1 kit by mouth as directed.  177 mL  0  . DISCONTD: arginine 500 MG tablet Take 500 mg by mouth 2 (two) times daily.        Marland Kitchen DISCONTD: Cholecalciferol (VITAMIN D3) 10000 UNITS capsule 10,000 Units. One tablet by mouth 3 x a week       . DISCONTD: EVENING PRIMROSE OIL PO Take 1 drop by mouth daily.        Marland Kitchen DISCONTD: ferrous fumarate-iron polysaccharide complex (TANDEM) 162-115.2 MG CAPS Take 1 capsule by mouth daily with breakfast.        . DISCONTD: Flaxseed, Linseed, (FLAX SEEDS) POWD Take by mouth. Take one heaping tablespoon daily       . DISCONTD: fluconazole (DIFLUCAN) 200 MG tablet Take 200 mg by mouth daily.        Marland Kitchen  DISCONTD: liothyronine (CYTOMEL) 5 MCG tablet Take 5 mcg by mouth daily.        Marland Kitchen DISCONTD: Multiple Vitamin (MULTIVITAMIN) tablet Take 1 tablet by mouth daily.        Marland Kitchen DISCONTD: Multiple Vitamins-Minerals (OPTI-GEN PO) Take 2 tablets by mouth at bedtime.        Marland Kitchen DISCONTD: progesterone (PROMETRIUM) 100 MG capsule Take 100 mg by mouth. 4 capsules by mouth daily        Review of Systems: Dry cough, sleeping problems, urinary leakage. Pertinent positive and negative review of systems were noted in the above HPI section. All other review of systems were otherwise negative.  Physical Exam: General: Well developed , well nourished, no acute distress Head: Normocephalic and atraumatic Eyes:  sclerae anicteric, EOMI Ears: Normal auditory acuity Mouth: No deformity or lesions Neck: Supple, no masses or thyromegaly Lungs: Clear throughout to auscultation Heart: Regular rate and rhythm; no murmurs, rubs or bruits Abdomen: Soft, non tender and non distended. No masses, hepatosplenomegaly or hernias noted. Normal Bowel sounds Rectal: Deferred to colonoscopy Musculoskeletal: Symmetrical with no gross deformities  Skin: No lesions on visible extremities Pulses:  Normal pulses noted Extremities: No clubbing, cyanosis, edema or deformities noted Neurological: Alert oriented x 4, grossly nonfocal Cervical Nodes:  No significant cervical adenopathy Inguinal Nodes: No  significant inguinal adenopathy Psychological:  Alert and cooperative. Normal mood and affect  Assessment and Recommendations:  1. Asymptomatic hemoccult-positive stool with iron deficiency anemia that corrected with iron supplementation. Rule out colorectal lesions such as colorectal neoplasms, AVMs. The risks, benefits, and alternatives to colonoscopy with possible biopsy and possible polypectomy were discussed with the patient and they consent to proceed.

## 2011-03-31 NOTE — Patient Instructions (Addendum)
You have been scheduled for a Colonoscopy. Separate instructions given. Pick up your SuPrep kit from your pharmacy.  cc: Mia Creek, MD

## 2011-04-08 ENCOUNTER — Ambulatory Visit
Admission: RE | Admit: 2011-04-08 | Discharge: 2011-04-08 | Disposition: A | Discharge status: Home / Self Care | Payer: 59 | Source: Ambulatory Visit | Attending: Family Medicine | Admitting: Family Medicine

## 2011-04-08 DIAGNOSIS — Z1231 Encounter for screening mammogram for malignant neoplasm of breast: Secondary | ICD-10-CM

## 2011-04-29 ENCOUNTER — Telehealth: Payer: Self-pay | Admitting: Gastroenterology

## 2011-04-29 DIAGNOSIS — R195 Other fecal abnormalities: Secondary | ICD-10-CM

## 2011-04-30 MED ORDER — NA SULFATE-K SULFATE-MG SULF 17.5-3.13-1.6 GM/177ML PO SOLN: 1.0000 | ORAL | Status: DC

## 2011-04-30 NOTE — Telephone Encounter (Signed)
Prescription for Suprep sent again to [facility].

## 2011-05-10 ENCOUNTER — Ambulatory Visit (AMBULATORY_SURGERY_CENTER): Payer: 59 | Admitting: Gastroenterology

## 2011-05-10 ENCOUNTER — Encounter: Payer: Self-pay | Admitting: Gastroenterology

## 2011-05-10 DIAGNOSIS — R195 Other fecal abnormalities: Secondary | ICD-10-CM

## 2011-05-10 DIAGNOSIS — D509 Iron deficiency anemia, unspecified: Secondary | ICD-10-CM

## 2011-05-10 MED ORDER — SODIUM CHLORIDE 0.9 % IV SOLN: 500.0000 mL | INTRAVENOUS | Status: DC

## 2011-05-10 NOTE — Patient Instructions (Signed)
FOLLOW DISCHARGE INSTRUCTIONS (BLUE & GREEN SHEETS).    

## 2011-05-11 ENCOUNTER — Telehealth: Payer: Self-pay | Admitting: *Deleted

## 2011-05-11 NOTE — Telephone Encounter (Signed)
Left message

## 2011-05-21 ENCOUNTER — Ambulatory Visit: Payer: 59 | Admitting: Gastroenterology

## 2012-04-17 ENCOUNTER — Other Ambulatory Visit: Payer: Self-pay | Admitting: Family Medicine

## 2012-04-17 DIAGNOSIS — Z1231 Encounter for screening mammogram for malignant neoplasm of breast: Secondary | ICD-10-CM

## 2012-05-03 ENCOUNTER — Ambulatory Visit: Payer: 59

## 2012-05-05 ENCOUNTER — Ambulatory Visit
Admission: RE | Admit: 2012-05-05 | Discharge: 2012-05-05 | Disposition: A | Discharge status: Home / Self Care | Payer: 59 | Source: Ambulatory Visit | Attending: Family Medicine | Admitting: Family Medicine

## 2012-05-05 DIAGNOSIS — Z1231 Encounter for screening mammogram for malignant neoplasm of breast: Secondary | ICD-10-CM

## 2012-05-08 ENCOUNTER — Ambulatory Visit: Payer: Self-pay | Admitting: Obstetrics and Gynecology

## 2012-05-08 ENCOUNTER — Ambulatory Visit: Payer: 59

## 2012-05-09 ENCOUNTER — Other Ambulatory Visit: Payer: Self-pay | Admitting: Family Medicine

## 2012-05-09 DIAGNOSIS — N63 Unspecified lump in unspecified breast: Secondary | ICD-10-CM

## 2012-05-15 ENCOUNTER — Ambulatory Visit
Admission: RE | Admit: 2012-05-15 | Discharge: 2012-05-15 | Disposition: A | Discharge status: Home / Self Care | Payer: 59 | Source: Ambulatory Visit | Attending: Family Medicine | Admitting: Family Medicine

## 2012-05-15 DIAGNOSIS — N63 Unspecified lump in unspecified breast: Secondary | ICD-10-CM

## 2012-10-23 ENCOUNTER — Other Ambulatory Visit (HOSPITAL_COMMUNITY)
Admission: RE | Admit: 2012-10-23 | Discharge: 2012-10-23 | Disposition: A | Discharge status: Home / Self Care | Payer: 59 | Source: Ambulatory Visit

## 2012-10-23 ENCOUNTER — Other Ambulatory Visit: Payer: Self-pay

## 2012-10-23 DIAGNOSIS — Z124 Encounter for screening for malignant neoplasm of cervix: Secondary | ICD-10-CM | POA: Insufficient documentation

## 2013-04-10 ENCOUNTER — Other Ambulatory Visit: Payer: Self-pay

## 2013-04-10 DIAGNOSIS — Z1231 Encounter for screening mammogram for malignant neoplasm of breast: Secondary | ICD-10-CM

## 2013-05-16 ENCOUNTER — Ambulatory Visit: Payer: 59

## 2013-05-24 ENCOUNTER — Ambulatory Visit
Admission: RE | Admit: 2013-05-24 | Discharge: 2013-05-24 | Disposition: A | Discharge status: Home / Self Care | Payer: 59 | Source: Ambulatory Visit

## 2013-05-24 DIAGNOSIS — Z1231 Encounter for screening mammogram for malignant neoplasm of breast: Secondary | ICD-10-CM

## 2013-07-30 ENCOUNTER — Other Ambulatory Visit: Payer: Self-pay | Admitting: Family Medicine

## 2013-07-30 ENCOUNTER — Ambulatory Visit
Admission: RE | Admit: 2013-07-30 | Discharge: 2013-07-30 | Disposition: A | Discharge status: Home / Self Care | Payer: 59 | Source: Ambulatory Visit | Attending: Family Medicine | Admitting: Family Medicine

## 2013-07-30 DIAGNOSIS — R05 Cough: Secondary | ICD-10-CM

## 2014-04-23 ENCOUNTER — Other Ambulatory Visit: Payer: Self-pay

## 2014-04-23 DIAGNOSIS — Z1231 Encounter for screening mammogram for malignant neoplasm of breast: Secondary | ICD-10-CM

## 2014-05-27 ENCOUNTER — Ambulatory Visit: Payer: 59

## 2014-06-06 ENCOUNTER — Ambulatory Visit: Payer: 59

## 2014-06-06 ENCOUNTER — Ambulatory Visit
Admission: RE | Admit: 2014-06-06 | Discharge: 2014-06-06 | Disposition: A | Discharge status: Home / Self Care | Payer: 59 | Source: Ambulatory Visit

## 2014-06-06 DIAGNOSIS — Z1231 Encounter for screening mammogram for malignant neoplasm of breast: Secondary | ICD-10-CM

## 2015-05-15 ENCOUNTER — Emergency Department (HOSPITAL_COMMUNITY): Payer: Commercial Managed Care - HMO

## 2015-05-15 ENCOUNTER — Emergency Department (HOSPITAL_COMMUNITY): Payer: Commercial Managed Care - HMO | Admitting: Anesthesiology

## 2015-05-15 ENCOUNTER — Ambulatory Visit (HOSPITAL_COMMUNITY)
Admission: EM | Admit: 2015-05-15 | Discharge: 2015-05-15 | Disposition: A | Discharge status: Home / Self Care | Payer: Commercial Managed Care - HMO | Attending: Emergency Medicine | Admitting: Emergency Medicine

## 2015-05-15 ENCOUNTER — Encounter (HOSPITAL_COMMUNITY): Payer: Self-pay

## 2015-05-15 ENCOUNTER — Other Ambulatory Visit (HOSPITAL_COMMUNITY): Payer: Self-pay | Admitting: Orthopedic Surgery

## 2015-05-15 ENCOUNTER — Encounter (HOSPITAL_COMMUNITY)
Admission: EM | Disposition: A | Discharge status: Home / Self Care | Payer: Self-pay | Source: Home / Self Care | Attending: Emergency Medicine

## 2015-05-15 DIAGNOSIS — W1789XA Other fall from one level to another, initial encounter: Secondary | ICD-10-CM | POA: Insufficient documentation

## 2015-05-15 DIAGNOSIS — S82891A Other fracture of right lower leg, initial encounter for closed fracture: Secondary | ICD-10-CM

## 2015-05-15 DIAGNOSIS — E039 Hypothyroidism, unspecified: Secondary | ICD-10-CM | POA: Diagnosis not present

## 2015-05-15 DIAGNOSIS — E669 Obesity, unspecified: Secondary | ICD-10-CM | POA: Diagnosis not present

## 2015-05-15 DIAGNOSIS — Y93H9 Activity, other involving exterior property and land maintenance, building and construction: Secondary | ICD-10-CM | POA: Diagnosis not present

## 2015-05-15 DIAGNOSIS — Z79899 Other long term (current) drug therapy: Secondary | ICD-10-CM | POA: Insufficient documentation

## 2015-05-15 DIAGNOSIS — E559 Vitamin D deficiency, unspecified: Secondary | ICD-10-CM | POA: Diagnosis not present

## 2015-05-15 DIAGNOSIS — I10 Essential (primary) hypertension: Secondary | ICD-10-CM | POA: Diagnosis not present

## 2015-05-15 DIAGNOSIS — D649 Anemia, unspecified: Secondary | ICD-10-CM | POA: Diagnosis not present

## 2015-05-15 DIAGNOSIS — S82841A Displaced bimalleolar fracture of right lower leg, initial encounter for closed fracture: Secondary | ICD-10-CM | POA: Insufficient documentation

## 2015-05-15 DIAGNOSIS — Z6830 Body mass index (BMI) 30.0-30.9, adult: Secondary | ICD-10-CM | POA: Insufficient documentation

## 2015-05-15 HISTORY — PX: ORIF ANKLE FRACTURE: SHX5408

## 2015-05-15 LAB — COMPREHENSIVE METABOLIC PANEL
ALK PHOS: 68 U/L (ref 38–126)
ALT: 22 U/L (ref 14–54)
AST: 28 U/L (ref 15–41)
Albumin: 4.1 g/dL (ref 3.5–5.0)
Anion gap: 12 (ref 5–15)
BILIRUBIN TOTAL: 1 mg/dL (ref 0.3–1.2)
BUN: 12 mg/dL (ref 6–20)
CALCIUM: 10.2 mg/dL (ref 8.9–10.3)
CO2: 27 mmol/L (ref 22–32)
CREATININE: 0.94 mg/dL (ref 0.44–1.00)
Chloride: 104 mmol/L (ref 101–111)
GFR calc Af Amer: >60 mL/min (ref >60)
GFR calc non Af Amer: >60 mL/min (ref >60)
GLUCOSE: 110 mg/dL — AB (ref 65–99)
Potassium: 3.4 mmol/L — ABNORMAL LOW (ref 3.5–5.1)
Sodium: 143 mmol/L (ref 135–145)
TOTAL PROTEIN: 7.8 g/dL (ref 6.5–8.1)

## 2015-05-15 LAB — ABO/RH: ABO/RH(D): B POS

## 2015-05-15 LAB — CBC WITH DIFFERENTIAL/PLATELET
BASOS PCT: 0 %
Basophils Absolute: 0 10*3/uL (ref 0.0–0.1)
Eosinophils Absolute: 0.2 10*3/uL (ref 0.0–0.7)
Eosinophils Relative: 3 %
HEMATOCRIT: 35.4 % — AB (ref 36.0–46.0)
HEMOGLOBIN: 11.9 g/dL — AB (ref 12.0–15.0)
Lymphocytes Relative: 47 %
Lymphs Abs: 2.8 10*3/uL (ref 0.7–4.0)
MCH: 31.7 pg (ref 26.0–34.0)
MCHC: 33.6 g/dL (ref 30.0–36.0)
MCV: 94.4 fL (ref 78.0–100.0)
MONO ABS: 0.5 10*3/uL (ref 0.1–1.0)
Monocytes Relative: 8 %
Neutro Abs: 2.5 10*3/uL (ref 1.7–7.7)
Neutrophils Relative %: 42 %
Platelets: 212 10*3/uL (ref 150–400)
RBC: 3.75 MIL/uL — ABNORMAL LOW (ref 3.87–5.11)
RDW: 13.7 % (ref 11.5–15.5)
WBC: 5.9 10*3/uL (ref 4.0–10.5)

## 2015-05-15 LAB — APTT: APTT: 28 s (ref 24–37)

## 2015-05-15 LAB — PROTIME-INR
INR: 0.97 (ref 0.00–1.49)
Prothrombin Time: 13.1 seconds (ref 11.6–15.2)

## 2015-05-15 LAB — TYPE AND SCREEN
ABO/RH(D): B POS
Antibody Screen: NEGATIVE

## 2015-05-15 LAB — HCG, SERUM, QUALITATIVE: Preg, Serum: NEGATIVE

## 2015-05-15 SURGERY — OPEN REDUCTION INTERNAL FIXATION (ORIF) ANKLE FRACTURE
Anesthesia: General | Site: Ankle | Laterality: Right

## 2015-05-15 MED ORDER — LACTATED RINGERS IV SOLN
Freq: Once | INTRAVENOUS | Status: AC
  Administered 2015-05-15: 17:00:00 via INTRAVENOUS

## 2015-05-15 MED ORDER — PROPOFOL 10 MG/ML IV BOLUS
INTRAVENOUS | Status: AC
  Filled 2015-05-15: qty 20

## 2015-05-15 MED ORDER — ASPIRIN EC 325 MG PO TBEC: 325.0000 mg | DELAYED_RELEASE_TABLET | Freq: Every day | ORAL | Status: DC

## 2015-05-15 MED ORDER — FENTANYL CITRATE (PF) 100 MCG/2ML IJ SOLN
INTRAMUSCULAR | Status: DC | PRN
  Administered 2015-05-15: 50 ug via INTRAVENOUS
  Administered 2015-05-15: 100 ug via INTRAVENOUS

## 2015-05-15 MED ORDER — HYDROMORPHONE HCL 1 MG/ML IJ SOLN: 0.2500 mg | INTRAMUSCULAR | Status: DC | PRN

## 2015-05-15 MED ORDER — PROPOFOL 10 MG/ML IV BOLUS
INTRAVENOUS | Status: DC | PRN
  Administered 2015-05-15: 200 mg via INTRAVENOUS

## 2015-05-15 MED ORDER — CEFAZOLIN SODIUM-DEXTROSE 2-3 GM-% IV SOLR: 2.0000 g | INTRAVENOUS | Status: DC

## 2015-05-15 MED ORDER — BUPIVACAINE-EPINEPHRINE (PF) 0.5% -1:200000 IJ SOLN
INTRAMUSCULAR | Status: DC | PRN
  Administered 2015-05-15: 30 mL via PERINEURAL
  Administered 2015-05-15: 7 mL

## 2015-05-15 MED ORDER — MIDAZOLAM HCL 5 MG/5ML IJ SOLN
INTRAMUSCULAR | Status: DC | PRN
  Administered 2015-05-15: 2 mg via INTRAVENOUS

## 2015-05-15 MED ORDER — NEOSTIGMINE METHYLSULFATE 10 MG/10ML IV SOLN
INTRAVENOUS | Status: AC
  Filled 2015-05-15: qty 1

## 2015-05-15 MED ORDER — DEXAMETHASONE SODIUM PHOSPHATE 10 MG/ML IJ SOLN
INTRAMUSCULAR | Status: AC
  Filled 2015-05-15: qty 1

## 2015-05-15 MED ORDER — 0.9 % SODIUM CHLORIDE (POUR BTL) OPTIME
TOPICAL | Status: DC | PRN
  Administered 2015-05-15: 1000 mL

## 2015-05-15 MED ORDER — OXYCODONE-ACETAMINOPHEN 5-325 MG PO TABS: 1.0000 | ORAL_TABLET | ORAL | Status: DC | PRN

## 2015-05-15 MED ORDER — ONDANSETRON HCL 4 MG/2ML IJ SOLN
INTRAMUSCULAR | Status: DC | PRN
  Administered 2015-05-15: 4 mg via INTRAVENOUS

## 2015-05-15 MED ORDER — CEFAZOLIN SODIUM-DEXTROSE 2-3 GM-% IV SOLR
INTRAVENOUS | Status: AC
  Filled 2015-05-15: qty 50

## 2015-05-15 MED ORDER — DEXAMETHASONE SODIUM PHOSPHATE 10 MG/ML IJ SOLN
INTRAMUSCULAR | Status: DC | PRN
  Administered 2015-05-15: 4 mg via INTRAVENOUS

## 2015-05-15 MED ORDER — FENTANYL CITRATE (PF) 100 MCG/2ML IJ SOLN
INTRAMUSCULAR | Status: AC
  Administered 2015-05-15: 100 ug via INTRAVENOUS
  Filled 2015-05-15: qty 2

## 2015-05-15 MED ORDER — GLYCOPYRROLATE 0.2 MG/ML IJ SOLN
INTRAMUSCULAR | Status: AC
  Filled 2015-05-15: qty 4

## 2015-05-15 MED ORDER — ROCURONIUM BROMIDE 50 MG/5ML IV SOLN
INTRAVENOUS | Status: AC
  Filled 2015-05-15: qty 1

## 2015-05-15 MED ORDER — MEPERIDINE HCL 25 MG/ML IJ SOLN: 6.2500 mg | INTRAMUSCULAR | Status: DC | PRN

## 2015-05-15 MED ORDER — CHLORHEXIDINE GLUCONATE 4 % EX LIQD: 60.0000 mL | Freq: Once | CUTANEOUS | Status: DC

## 2015-05-15 MED ORDER — HYDROMORPHONE HCL 1 MG/ML IJ SOLN
1.0000 mg | Freq: Once | INTRAMUSCULAR | Status: AC
  Administered 2015-05-15: 1 mg via INTRAVENOUS
  Filled 2015-05-15: qty 1

## 2015-05-15 MED ORDER — HYDROMORPHONE HCL 1 MG/ML IJ SOLN
1.0000 mg | INTRAMUSCULAR | Status: DC | PRN
  Administered 2015-05-15: 1 mg via INTRAVENOUS
  Filled 2015-05-15: qty 1

## 2015-05-15 MED ORDER — ONDANSETRON HCL 4 MG/2ML IJ SOLN
INTRAMUSCULAR | Status: AC
  Filled 2015-05-15: qty 2

## 2015-05-15 MED ORDER — PROMETHAZINE HCL 25 MG/ML IJ SOLN: 6.2500 mg | INTRAMUSCULAR | Status: DC | PRN

## 2015-05-15 MED ORDER — LIDOCAINE HCL (CARDIAC) 20 MG/ML IV SOLN
INTRAVENOUS | Status: AC
  Filled 2015-05-15: qty 5

## 2015-05-15 MED ORDER — LIDOCAINE HCL (CARDIAC) 20 MG/ML IV SOLN
INTRAVENOUS | Status: DC | PRN
  Administered 2015-05-15: 20 mg via INTRAVENOUS

## 2015-05-15 MED ORDER — LACTATED RINGERS IV SOLN
INTRAVENOUS | Status: DC | PRN
  Administered 2015-05-15 (×2): via INTRAVENOUS

## 2015-05-15 MED ORDER — MIDAZOLAM HCL 2 MG/2ML IJ SOLN
2.0000 mg | Freq: Once | INTRAMUSCULAR | Status: AC
  Administered 2015-05-15: 1 mg via INTRAVENOUS
  Filled 2015-05-15: qty 2

## 2015-05-15 MED ORDER — FENTANYL CITRATE (PF) 100 MCG/2ML IJ SOLN
100.0000 ug | Freq: Once | INTRAMUSCULAR | Status: AC
  Administered 2015-05-15: 100 ug via INTRAVENOUS

## 2015-05-15 MED ORDER — MIDAZOLAM HCL 2 MG/2ML IJ SOLN
INTRAMUSCULAR | Status: AC
  Filled 2015-05-15: qty 4

## 2015-05-15 MED ORDER — FENTANYL CITRATE (PF) 250 MCG/5ML IJ SOLN
INTRAMUSCULAR | Status: AC
  Filled 2015-05-15: qty 5

## 2015-05-15 MED ORDER — MIDAZOLAM HCL 2 MG/2ML IJ SOLN
INTRAMUSCULAR | Status: AC
  Administered 2015-05-15: 1 mg via INTRAVENOUS
  Filled 2015-05-15: qty 2

## 2015-05-15 MED ORDER — MIDAZOLAM HCL 2 MG/2ML IJ SOLN: 0.5000 mg | Freq: Once | INTRAMUSCULAR | Status: DC | PRN

## 2015-05-15 SURGICAL SUPPLY — 49 items
BANDAGE ESMARK 6X9 LF (GAUZE/BANDAGES/DRESSINGS) ×1 IMPLANT
BIT DRILL 2.5X110 QC LCP DISP (BIT) ×3 IMPLANT
BIT DRILL CANN 2.7X625 NONSTRL (BIT) ×3 IMPLANT
BNDG COHESIVE 4X5 TAN STRL (GAUZE/BANDAGES/DRESSINGS) ×3 IMPLANT
BNDG ESMARK 6X9 LF (GAUZE/BANDAGES/DRESSINGS) ×3
BNDG GAUZE ELAST 4 BULKY (GAUZE/BANDAGES/DRESSINGS) ×3 IMPLANT
COVER SURGICAL LIGHT HANDLE (MISCELLANEOUS) ×6 IMPLANT
CUFF TOURNIQUET SINGLE 34IN LL (TOURNIQUET CUFF) IMPLANT
CUFF TOURNIQUET SINGLE 44IN (TOURNIQUET CUFF) IMPLANT
DRAPE INCISE IOBAN 66X45 STRL (DRAPES) IMPLANT
DRAPE OEC MINIVIEW 54X84 (DRAPES) ×3 IMPLANT
DRAPE PROXIMA HALF (DRAPES) ×3 IMPLANT
DRAPE U-SHAPE 47X51 STRL (DRAPES) ×3 IMPLANT
DRSG ADAPTIC 3X8 NADH LF (GAUZE/BANDAGES/DRESSINGS) ×3 IMPLANT
DRSG PAD ABDOMINAL 8X10 ST (GAUZE/BANDAGES/DRESSINGS) ×6 IMPLANT
DURAPREP 26ML APPLICATOR (WOUND CARE) ×3 IMPLANT
ELECT REM PT RETURN 9FT ADLT (ELECTROSURGICAL) ×3
ELECTRODE REM PT RTRN 9FT ADLT (ELECTROSURGICAL) ×1 IMPLANT
GAUZE SPONGE 4X4 12PLY STRL (GAUZE/BANDAGES/DRESSINGS) ×3 IMPLANT
GLOVE BIOGEL PI IND STRL 9 (GLOVE) ×1 IMPLANT
GLOVE BIOGEL PI INDICATOR 9 (GLOVE) ×2
GLOVE SURG ORTHO 9.0 STRL STRW (GLOVE) ×3 IMPLANT
GOWN STRL REUS W/ TWL XL LVL3 (GOWN DISPOSABLE) ×3 IMPLANT
GOWN STRL REUS W/TWL XL LVL3 (GOWN DISPOSABLE) ×6
GUIDEWARE NON THREAD 1.25X150 (WIRE) ×6
GUIDEWIRE NON THREAD 1.25X150 (WIRE) ×2 IMPLANT
KIT BASIN OR (CUSTOM PROCEDURE TRAY) ×3 IMPLANT
KIT ROOM TURNOVER OR (KITS) ×3 IMPLANT
MANIFOLD NEPTUNE II (INSTRUMENTS) IMPLANT
NS IRRIG 1000ML POUR BTL (IV SOLUTION) ×3 IMPLANT
PACK ORTHO EXTREMITY (CUSTOM PROCEDURE TRAY) ×3 IMPLANT
PAD ARMBOARD 7.5X6 YLW CONV (MISCELLANEOUS) ×6 IMPLANT
PLATE LCP 3.5 1/3 TUB 8HX93 (Plate) ×3 IMPLANT
SCREW CORTEX 3.5 12MM (Screw) ×6 IMPLANT
SCREW LOCK CORT ST 3.5X12 (Screw) ×3 IMPLANT
SCREW LOCK T15 FT 12X3.5X2.9X (Screw) ×1 IMPLANT
SCREW LOCK T15 FT 14X3.5X2.9X (Screw) ×1 IMPLANT
SCREW LOCKING 3.5X12 (Screw) ×2 IMPLANT
SCREW LOCKING 3.5X14 (Screw) ×2 IMPLANT
SCREW SHORT THREAD 4.0X40 (Screw) ×6 IMPLANT
SPONGE LAP 18X18 X RAY DECT (DISPOSABLE) ×3 IMPLANT
STAPLER VISISTAT 35W (STAPLE) IMPLANT
SUCTION FRAZIER TIP 10 FR DISP (SUCTIONS) ×3 IMPLANT
SUT ETHILON 2 0 PSLX (SUTURE) ×3 IMPLANT
SUT VIC AB 2-0 CTB1 (SUTURE) ×6 IMPLANT
TOWEL OR 17X24 6PK STRL BLUE (TOWEL DISPOSABLE) ×3 IMPLANT
TOWEL OR 17X26 10 PK STRL BLUE (TOWEL DISPOSABLE) ×3 IMPLANT
TUBE CONNECTING 12'X1/4 (SUCTIONS) ×1
TUBE CONNECTING 12X1/4 (SUCTIONS) ×2 IMPLANT

## 2015-05-15 NOTE — Progress Notes (Signed)
Marcie Bal, CRNA at bedside preparing to take pt back to OR.

## 2015-05-15 NOTE — Progress Notes (Signed)
Orthopedic Tech Progress Note Patient Details:  Kayla Blake 07-Sep-1965 [id number]  Ortho Devices Type of Ortho Device: Ace wrap, Post (short leg) splint Ortho Device/Splint Interventions: Application   [name] 05/15/2015, 11:32 AM

## 2015-05-15 NOTE — Anesthesia Preprocedure Evaluation (Addendum)
Anesthesia Evaluation  Patient identified by MRN, date of birth, ID band Patient awake    Reviewed: Allergy & Precautions, NPO status , Patient's Chart, lab work & pertinent test results  History of Anesthesia Complications Negative for: history of anesthetic complications  Airway Mallampati: I  TM Distance: >3 FB Neck ROM: Full    Dental  (+) Chipped, Dental Advisory Given   Pulmonary neg pulmonary ROS,    breath sounds clear to auscultation       Cardiovascular hypertension, Pt. on medications + angina  Rhythm:Regular Rate:Normal     Neuro/Psych negative neurological ROS     GI/Hepatic negative GI ROS,   Endo/Other  negative endocrine ROS  Renal/GU negative Renal ROS     Musculoskeletal   Abdominal (+) + obese,   Peds  Hematology   Anesthesia Other Findings   Reproductive/Obstetrics LMP 3 months ago                            Anesthesia Physical Anesthesia Plan  ASA: II  Anesthesia Plan: General   Post-op Pain Management: GA combined w/ Regional for post-op pain   Induction: Intravenous  Airway Management Planned: LMA  Additional Equipment:   Intra-op Plan:   Post-operative Plan:   Informed Consent: I have reviewed the patients History and Physical, chart, labs and discussed the procedure including the risks, benefits and alternatives for the proposed anesthesia with the patient or authorized representative who has indicated his/her understanding and acceptance.   Dental advisory given  Plan Discussed with: CRNA and Surgeon  Anesthesia Plan Comments: (Plan routine monitors, GA- LMA ok, popliteal block for post op analgesia)        Anesthesia Quick Evaluation

## 2015-05-15 NOTE — ED Notes (Signed)
Ortho tech placed the patient in a short leg splint.

## 2015-05-15 NOTE — Op Note (Signed)
05/15/2015  6:48 PM  PATIENT:  Kayla Blake    PRE-OPERATIVE DIAGNOSIS:  Right Ankle Fracture bimalleolar  POST-OPERATIVE DIAGNOSIS:  Same  PROCEDURE:  OPEN REDUCTION INTERNAL FIXATION (ORIF) ANKLE FRACTURE bimalleolar  SURGEON:  Newt Minion, MD  PHYSICIAN ASSISTANT:None ANESTHESIA:   General  PREOPERATIVE INDICATIONS:  [name] is a  49 y.o. female with a diagnosis of Right Ankle Fracture who failed conservative measures and elected for surgical management.    The risks benefits and alternatives were discussed with the patient preoperatively including but not limited to the risks of infection, bleeding, nerve injury, cardiopulmonary complications, the need for revision surgery, among others, and the patient was willing to proceed.  OPERATIVE IMPLANTS: 8 hole one third tubular plate and medial compression screws  OPERATIVE FINDINGS: Significant comminution of the fibular fracture with segmental defects with loose bone.  OPERATIVE PROCEDURE: Patient was brought to the operating room and underwent a general anesthetic after a popliteal block. After adequate levels of anesthesia were obtained patient's right lower extremity was prepped using DuraPrep draped into a sterile field. A timeout was called. A lateral incision was made over the fibula sharp dissection was carried down to the fracture site. The fracture edges were freshened. There were multiple large loose fragments without soft tissue attached these were repositioned to obtain proper length and alignment. Using indirect reduction a one third tubular plate was secured to the distal aspect the fibula with 2 locking screws and then with distraction along the plate the fibula was fixed out to length and 3 compression screws were placed proximally. The loose fragments were sutured in place with the soft tissue. The wound was irrigated with normal saline C-arm fluoroscopy verified reduction and alignment. A separate incision was  then made medially. The medial malleolar fragment was cleansed and reduced and stabilized with 24.0 cannulated screws 40 mm in length. Again the wound was irrigated subcutaneous was closed using 2-0 Vicryl skin was closed using 2-0 nylon for both wounds. A sterile compressive dressing was applied patient was extubated taken to the PACU in stable condition.

## 2015-05-15 NOTE — ED Provider Notes (Signed)
CSN: [id number]     Arrival date & time 05/15/15  7035 History   First MD Initiated Contact with Patient 05/15/15 [phone]     Chief Complaint  Patient presents with  . Leg Injury     (Consider location/radiation/quality/duration/timing/severity/associated sxs/prior Treatment) The history is provided by the patient and medical records.   49 year old female with history of hypertension, hypothyroidism, anemia, vitamin D deficiency, presenting to the ED for right ankle injury. Patient states she was doing yard work this morning when she slipped off a trailer and rolled her right ankle. She was wearing sneakers at that time. Patient has obvious deformity to medial right ankle. She denies any numbness or weakness. No prior right ankle injuries or surgeries in the past. Patient cannot bear weight on right foot at this time. No head injury or loss of consciousness. Patient has not had any oral intake thus far today. No medication allergies noted. Vital signs stable.  Past Medical History  Diagnosis Date  . Alcohol abuse   . Allergic rhinitis   . Heart murmur   . Hypertension   . Uterine fibroids affecting pregnancy   . Hypothyroidism   . Vitamin D deficiency   . Anemia    Past Surgical History  Procedure Laterality Date  . Cholecystectomy    . Tubal ligation     Family History  Problem Relation Age of Onset  . Breast cancer Sister   . Colon cancer Neg Hx    Social History  Substance Use Topics  . Smoking status: Never Smoker   . Smokeless tobacco: Never Used  . Alcohol Use: 10.4 oz/week    14 Cans of beer, 4 Standard drinks or equivalent per week     Comment: on weekends    OB History    No data available     Review of Systems  Musculoskeletal: Positive for joint swelling and arthralgias.  All other systems reviewed and are negative.     Allergies  Review of patient's allergies indicates no known allergies.  Home Medications   Prior to Admission medications    Medication Sig Start Date End Date Taking? Authorizing Provider  lisinopril (PRINIVIL,ZESTRIL) 5 MG tablet Take 5 mg by mouth daily.      Historical Provider, MD  losartan-hydrochlorothiazide (HYZAAR) 50-12.5 MG per tablet  04/13/11   Historical Provider, MD   BP 151/86 mmHg  Pulse 71  Temp(Src) 98 F (36.7 C) (Oral)  Resp 24  Ht 5\' 5"  (1.651 m)  Wt 182 lb (82.555 kg)  BMI 30.29 kg/m2  SpO2 100%  LMP 07/08/2013   Physical Exam  Constitutional: She is oriented to person, place, and time. She appears well-developed and well-nourished.  HENT:  Head: Normocephalic and atraumatic.  Mouth/Throat: Oropharynx is clear and moist.  Eyes: Conjunctivae and EOM are normal. Pupils are equal, round, and reactive to light.  Neck: Normal range of motion.  Cardiovascular: Normal rate, regular rhythm and normal heart sounds.   Pulmonary/Chest: Effort normal and breath sounds normal.  Abdominal: Soft. Bowel sounds are normal.  Musculoskeletal: Normal range of motion.  Deformity noted of medial right ankle; very TTP; limited ROM as expected due to pain; DP pulse intact; moving all toes appropriately; normal sensation Compartments soft, easily compressible  Neurological: She is alert and oriented to person, place, and time.  Skin: Skin is warm and dry.  Psychiatric: She has a normal mood and affect.  Nursing note and vitals reviewed.   ED Course  Procedures (including critical  care time) Labs Review Labs Reviewed  CBC WITH DIFFERENTIAL/PLATELET - Abnormal; Notable for the following:    RBC 3.75 (*)    Hemoglobin 11.9 (*)    HCT 35.4 (*)    All other components within normal limits  COMPREHENSIVE METABOLIC PANEL - Abnormal; Notable for the following:    Potassium 3.4 (*)    Glucose, Bld 110 (*)    All other components within normal limits  PROTIME-INR  TYPE AND SCREEN  ABO/RH    Imaging Review Dg Chest 2 View  05/15/2015  CLINICAL DATA:  Pre-Op ankle surgery,   No chest problems  EXAM: CHEST  2 VIEW COMPARISON:  07/30/2013 FINDINGS: The heart size and mediastinal contours are within normal limits. Both lungs are clear. The visualized skeletal structures are unremarkable. IMPRESSION: No active cardiopulmonary disease. Electronically Signed   By: Kathreen Devoid   On: 05/15/2015 11:34   Dg Ankle Complete Right  05/15/2015  CLINICAL DATA:  Status post fall. EXAM: RIGHT ANKLE - COMPLETE 3+ VIEW COMPARISON:  None. FINDINGS: There is fracture dislocation of the right ankle. There is a comminuted, laterally displaced medial malleolar fracture. There is a comminuted fracture of the distal fibular diametaphysis. There is disruption of the ankle mortise with opposed lateral dislocation of the talar dome relative to the tibial plafond. There is no other fracture or dislocation. IMPRESSION: Right ankle fracture dislocation as described above. Electronically Signed   By: Kathreen Devoid   On: 05/15/2015 10:15   I have personally reviewed and evaluated these images and lab results as part of my medical decision-making.   EKG Interpretation None      MDM   Final diagnoses:  Fracture dislocation of right ankle joint, closed, initial encounter   49 year old female here following fall from trailer. She rolled her right ankle. No head injury or loss of consciousness.  On exam patient has an obvious deformity of her medial right ankle with tenderness as expected. DP pulse intact. She is moving all toes appropriately. Normal sensation throughout. Compartments soft and easily compressible.  X-ray confirms extensive fracture dislocation of right tibia and comminuted fracture of distal fibula as well. Case was discussed with orthopedics, Dr. Sharol Given, who will take to OR this afternoon.  Patient to remain NPO, elevate ankle, apply ice and splint for now.  Pre-op CBC, CMP, ekg, and CXR obtained-- no acute findings.  Patient pain is controlled well with dilaudid.  2:22 PM Patient resting comfortable, VS  remain stable.  Waiting for OR.  Larene Pickett, PA-C 05/15/15 [place], MD 05/16/15 2106

## 2015-05-15 NOTE — H&P (Signed)
Kayla Blake is an 49 y.o. female.   Chief Complaint: Fracture dislocation right ankle HPI: Patient is a 49 year old woman who states that she was on a trailer time to bring some yard debris on a tarp up the trailer when she slipped on the back gait and fell sustaining a fracture dislocation of her right ankle.  Past Medical History  Diagnosis Date  . Alcohol abuse   . Allergic rhinitis   . Heart murmur   . Hypertension   . Uterine fibroids affecting pregnancy   . Hypothyroidism   . Vitamin D deficiency   . Anemia     Past Surgical History  Procedure Laterality Date  . Cholecystectomy    . Tubal ligation      Family History  Problem Relation Age of Onset  . Breast cancer Sister   . Colon cancer Neg Hx    Social History:  reports that she has never smoked. She has never used smokeless tobacco. She reports that she drinks about 10.4 oz of alcohol per week. She reports that she does not use illicit drugs.  Allergies: No Known Allergies  Medications Prior to Admission  Medication Sig Dispense Refill  . amLODipine (NORVASC) 5 MG tablet Take 5 mg by mouth daily.    . hydrochlorothiazide (HYDRODIURIL) 25 MG tablet Take 25 mg by mouth daily.    . Multiple Vitamins-Minerals (MULTIVITAMINS THER. W/MINERALS) TABS tablet Take 1 tablet by mouth daily.    Marland Kitchen PARoxetine (PAXIL) 20 MG tablet Take 20 mg by mouth daily.      Results for orders placed or performed during the hospital encounter of 05/15/15 (from the past 48 hour(s))  CBC with Differential     Status: Abnormal   Collection Time: 05/15/15  9:50 AM  Result Value Ref Range   WBC 5.9 4.0 - 10.5 K/uL   RBC 3.75 (L) 3.87 - 5.11 MIL/uL   Hemoglobin 11.9 (L) 12.0 - 15.0 g/dL   HCT 35.4 (L) 36.0 - 46.0 %   MCV 94.4 78.0 - 100.0 fL   MCH 31.7 26.0 - 34.0 pg   MCHC 33.6 30.0 - 36.0 g/dL   RDW 13.7 11.5 - 15.5 %   Platelets 212 150 - 400 K/uL   Neutrophils Relative % 42 %   Neutro Abs 2.5 1.7 - 7.7 K/uL   Lymphocytes Relative  47 %   Lymphs Abs 2.8 0.7 - 4.0 K/uL   Monocytes Relative 8 %   Monocytes Absolute 0.5 0.1 - 1.0 K/uL   Eosinophils Relative 3 %   Eosinophils Absolute 0.2 0.0 - 0.7 K/uL   Basophils Relative 0 %   Basophils Absolute 0.0 0.0 - 0.1 K/uL  Comprehensive metabolic panel     Status: Abnormal   Collection Time: 05/15/15  9:50 AM  Result Value Ref Range   Sodium 143 135 - 145 mmol/L   Potassium 3.4 (L) 3.5 - 5.1 mmol/L   Chloride 104 101 - 111 mmol/L   CO2 27 22 - 32 mmol/L   Glucose, Bld 110 (H) 65 - 99 mg/dL   BUN 12 6 - 20 mg/dL   Creatinine, Ser 0.94 0.44 - 1.00 mg/dL   Calcium 10.2 8.9 - 10.3 mg/dL   Total Protein 7.8 6.5 - 8.1 g/dL   Albumin 4.1 3.5 - 5.0 g/dL   AST 28 15 - 41 U/L   ALT 22 14 - 54 U/L   Alkaline Phosphatase 68 38 - 126 U/L   Total Bilirubin 1.0 0.3 -  1.2 mg/dL   GFR calc non Af Amer >60 >60 mL/min   GFR calc Af Amer >60 >60 mL/min    Comment: (NOTE) The eGFR has been calculated using the CKD EPI equation. This calculation has not been validated in all clinical situations. eGFR's persistently <60 mL/min signify possible Chronic Kidney Disease.    Anion gap 12 5 - 15  Protime-INR     Status: None   Collection Time: 05/15/15  9:50 AM  Result Value Ref Range   Prothrombin Time 13.1 11.6 - 15.2 seconds   INR 0.97 0.00 - 1.49  Type and screen Person     Status: None   Collection Time: 05/15/15  9:50 AM  Result Value Ref Range   ABO/RH(D) B POS    Antibody Screen NEG    Sample Expiration 05/18/2015   ABO/Rh     Status: None   Collection Time: 05/15/15  9:50 AM  Result Value Ref Range   ABO/RH(D) B POS   hCG, serum, qualitative     Status: None   Collection Time: 05/15/15  4:40 PM  Result Value Ref Range   Preg, Serum NEGATIVE NEGATIVE    Comment:        THE SENSITIVITY OF THIS METHODOLOGY IS >10 mIU/mL.   APTT     Status: None   Collection Time: 05/15/15  4:40 PM  Result Value Ref Range   aPTT 28 24 - 37 seconds   Dg Chest 2  View  05/15/2015  CLINICAL DATA:  Pre-Op ankle surgery,   No chest problems EXAM: CHEST  2 VIEW COMPARISON:  07/30/2013 FINDINGS: The heart size and mediastinal contours are within normal limits. Both lungs are clear. The visualized skeletal structures are unremarkable. IMPRESSION: No active cardiopulmonary disease. Electronically Signed   By: Kathreen Devoid   On: 05/15/2015 11:34   Dg Ankle Complete Right  05/15/2015  CLINICAL DATA:  Status post fall. EXAM: RIGHT ANKLE - COMPLETE 3+ VIEW COMPARISON:  None. FINDINGS: There is fracture dislocation of the right ankle. There is a comminuted, laterally displaced medial malleolar fracture. There is a comminuted fracture of the distal fibular diametaphysis. There is disruption of the ankle mortise with opposed lateral dislocation of the talar dome relative to the tibial plafond. There is no other fracture or dislocation. IMPRESSION: Right ankle fracture dislocation as described above. Electronically Signed   By: Kathreen Devoid   On: 05/15/2015 10:15    Review of Systems  All other systems reviewed and are negative.   Blood pressure 121/85, pulse 64, temperature 98 F (36.7 C), temperature source Oral, resp. rate 24, height 5' 5"  (1.651 m), weight 82.555 kg (182 lb), last menstrual period 07/08/2013, SpO2 94 %. Physical Exam  On examination patient has a splint in place. She has a palpable dorsalis pedis pulse her foot is neurovascular intact with good capillary refill. A radiograph is reviewed which shows a fracture dislocation of the right ankle fracture both the medial and lateral malleolus. Assessment/Plan Assessment bimalleolar fracture dislocation right ankle.  Plan: We will plan for open reduction internal fixation. Discussed the risks and benefits of surgery with risks being infection neurovascular injury persistent pain DVT arthritis need for additional surgery. Also discussed risks of the skin injury secondary to the dislocation of the ankle.  Patient states she understands wishes to proceed at this time patient wished for discharge to home we will provide a prescription for discharge. The importance of ice elevation nonweightbearing was also  discussed.  DUDA,MARCUS V 05/15/2015, 5:14 PM

## 2015-05-15 NOTE — ED Notes (Signed)
Pt reports she was doing yard work and slipped off of a trailer injuring her right ankle.  PT has obvious deformity to ankle, pedal pulses are present.  Pt denies any other injuries and reports she did not hit her head/neck.

## 2015-05-15 NOTE — Progress Notes (Signed)
Orthopedic Tech Progress Note Patient Details:  Kayla Blake 09-20-1965 [id number] Applied CAM walker to RLE.  Fit pt. for crutches and left crutches with pt.'s nurse for use upon discharge. Ortho Devices Type of Ortho Device: CAM walker, Crutches Ortho Device/Splint Location: RLE Ortho Device/Splint Interventions: Application   Darrol Poke 05/15/2015, 7:50 PM

## 2015-05-15 NOTE — ED Provider Notes (Signed)
Medical screening examination/treatment/procedure(s) were conducted as a shared visit with non-physician practitioner(s) and myself.  I personally evaluated the patient during the encounter.   EKG Interpretation   Date/Time:  Thursday May 15 2015 10:50:14 EDT Ventricular Rate:  56 PR Interval:  151 QRS Duration: 86 QT Interval:  440 QTC Calculation: 425 R Axis:   17 Text Interpretation:  Sinus rhythm Low voltage, precordial leads Consider  anterior infarct Borderline repolarization abnormality Baseline wander in  lead(s) II III aVR aVL aVF V6 No significant change was found Confirmed by  [facility]  MD, TREY ([id number]) on 05/15/2015 12:34:03 PM      49 yo female with right ankle injury from stepping off of a trailer.  On exam, well appearing, nontoxic, not distressed, normal respiratory effort, normal perfusion, right ankle in splint, toes well perfused, sensation intact, no pain with passive ROM of great toe.  Plain films show fracture dislocation.  Ortho consulted and will take to OR.    Clinical Impression: 1. Fracture dislocation of right ankle joint, closed, initial encounter       Serita Grit, MD 05/15/15 1333

## 2015-05-15 NOTE — Anesthesia Procedure Notes (Addendum)
Anesthesia Regional Block:  Popliteal block  Pre-Anesthetic Checklist: ,, timeout performed, Correct Patient, Correct Site, Correct Laterality, Correct Procedure, Correct Position, site marked, Risks and benefits discussed,  Surgical consent,  Pre-op evaluation,  At surgeon's request and post-op pain management  Laterality: Right and Lower  Prep: chloraprep       Needles:  Injection technique: Single-shot     Needle Length: 9cm 9 cm Needle Gauge: 22 and 22 G    Additional Needles:  Procedures: nerve stimulator Popliteal block  Nerve Stimulator or Paresthesia:  Response: toe dorsiflexion , 0.4 mA, 0.1 ms,  Response: toe abduction, 0.4 mA, 0.1 ms,   Additional Responses:   Narrative:  Start time: 05/15/2015 5:16 PM End time: 05/15/2015 5:22 PM Injection made incrementally with aspirations every 5 mL.  Performed by: Personally  Anesthesiologist: Glennon Mac, CARSWELL  Additional Notes: Pt identified in Holding room.  Monitors applied. Working IV access confirmed. Sterile prep R lateral knee.  #22ga PNS to toe dorsiflexion and toe abduction twitches at 0.66mA threshold.  30cc 0.5% Bupivacaine with 1:200k epi injected incrementally after negative test dose. Re-prep prox, ant-med tibia and 7cc 0.5% Bupivacaine with 1:200k epi injected locally for saphenous nerve supplementation. Patient asymptomatic, VSS, no heme aspirated, tolerated well.  Jenita Seashore, MD   Procedure Name: LMA Insertion Date/Time: 05/15/2015 5:37 PM Performed by: Ignacia Bayley Pre-anesthesia Checklist: Patient identified, Emergency Drugs available, Suction available and Patient being monitored Patient Re-evaluated:Patient Re-evaluated prior to inductionOxygen Delivery Method: Circle system utilized Preoxygenation: Pre-oxygenation with 100% oxygen Intubation Type: IV induction LMA: LMA inserted LMA Size: 4.0 Number of attempts: 1 Placement Confirmation: positive ETCO2,  CO2 detector and breath sounds  checked- equal and bilateral Tube secured with: Tape Dental Injury: Teeth and Oropharynx as per pre-operative assessment

## 2015-05-15 NOTE — Anesthesia Postprocedure Evaluation (Signed)
  Anesthesia Post-op Note  Patient: Kayla Blake  Procedure(s) Performed: Procedure(s): OPEN REDUCTION INTERNAL FIXATION (ORIF) ANKLE FRACTURE (Right)  Patient Location: PACU  Anesthesia Type:GA combined with regional for post-op pain  Level of Consciousness: awake  Airway and Oxygen Therapy: Patient Spontanous Breathing  Post-op Pain: none  Post-op Assessment: Post-op Vital signs reviewed, Patient's Cardiovascular Status Stable, Respiratory Function Stable, Patent Airway, No signs of Nausea or vomiting and Pain level controlled              Post-op Vital Signs: Reviewed and stable  Last Vitals:  Filed Vitals:   05/15/15 2001  BP: 140/81  Pulse: 78  Temp: 36.7 C  Resp: 24    Complications: No apparent anesthesia complications

## 2015-05-15 NOTE — Transfer of Care (Signed)
Immediate Anesthesia Transfer of Care Note  Patient: Kayla Blake  Procedure(s) Performed: Procedure(s): OPEN REDUCTION INTERNAL FIXATION (ORIF) ANKLE FRACTURE (Right)  Patient Location: PACU  Anesthesia Type:GA combined with regional for post-op pain  Level of Consciousness: awake  Airway & Oxygen Therapy: Patient Spontanous Breathing and Patient connected to nasal cannula oxygen  Post-op Assessment: Report given to RN and Post -op Vital signs reviewed and stable  Post vital signs: stable  Last Vitals:  Filed Vitals:   05/15/15 1901  BP:   Pulse:   Temp: 37.1 C  Resp:     Complications: No apparent anesthesia complications

## 2015-05-16 ENCOUNTER — Other Ambulatory Visit: Payer: Self-pay

## 2015-05-16 ENCOUNTER — Encounter (HOSPITAL_COMMUNITY): Payer: Self-pay | Admitting: Orthopedic Surgery

## 2015-05-16 DIAGNOSIS — Z1231 Encounter for screening mammogram for malignant neoplasm of breast: Secondary | ICD-10-CM

## 2015-06-10 ENCOUNTER — Ambulatory Visit
Admission: RE | Admit: 2015-06-10 | Discharge: 2015-06-10 | Disposition: A | Discharge status: Home / Self Care | Payer: Commercial Managed Care - HMO | Source: Ambulatory Visit

## 2015-06-10 DIAGNOSIS — Z1231 Encounter for screening mammogram for malignant neoplasm of breast: Secondary | ICD-10-CM

## 2015-06-12 ENCOUNTER — Other Ambulatory Visit: Payer: Self-pay | Admitting: Family Medicine

## 2015-06-12 DIAGNOSIS — R928 Other abnormal and inconclusive findings on diagnostic imaging of breast: Secondary | ICD-10-CM

## 2015-06-18 ENCOUNTER — Ambulatory Visit
Admission: RE | Admit: 2015-06-18 | Discharge: 2015-06-18 | Disposition: A | Discharge status: Home / Self Care | Payer: Commercial Managed Care - HMO | Source: Ambulatory Visit | Attending: Family Medicine | Admitting: Family Medicine

## 2015-06-18 ENCOUNTER — Other Ambulatory Visit: Payer: Self-pay | Admitting: Family Medicine

## 2015-06-18 DIAGNOSIS — R928 Other abnormal and inconclusive findings on diagnostic imaging of breast: Secondary | ICD-10-CM

## 2015-06-19 ENCOUNTER — Ambulatory Visit: Payer: Commercial Managed Care - HMO

## 2015-06-24 ENCOUNTER — Other Ambulatory Visit: Payer: Self-pay | Admitting: Family Medicine

## 2015-06-24 DIAGNOSIS — R928 Other abnormal and inconclusive findings on diagnostic imaging of breast: Secondary | ICD-10-CM

## 2015-06-25 ENCOUNTER — Ambulatory Visit
Admission: RE | Admit: 2015-06-25 | Discharge: 2015-06-25 | Disposition: A | Discharge status: Home / Self Care | Payer: Commercial Managed Care - HMO | Source: Ambulatory Visit | Attending: Family Medicine | Admitting: Family Medicine

## 2015-06-25 DIAGNOSIS — R928 Other abnormal and inconclusive findings on diagnostic imaging of breast: Secondary | ICD-10-CM

## 2015-07-01 ENCOUNTER — Other Ambulatory Visit: Payer: Self-pay | Admitting: General Surgery

## 2015-07-01 DIAGNOSIS — N63 Unspecified lump in unspecified breast: Secondary | ICD-10-CM

## 2015-07-10 ENCOUNTER — Other Ambulatory Visit: Payer: Self-pay | Admitting: General Surgery

## 2015-07-10 ENCOUNTER — Encounter (HOSPITAL_BASED_OUTPATIENT_CLINIC_OR_DEPARTMENT_OTHER): Payer: Self-pay | Admitting: *Deleted

## 2015-07-10 DIAGNOSIS — N63 Unspecified lump in unspecified breast: Secondary | ICD-10-CM

## 2015-07-11 ENCOUNTER — Ambulatory Visit
Admission: RE | Admit: 2015-07-11 | Discharge: 2015-07-11 | Disposition: A | Discharge status: Home / Self Care | Payer: Commercial Managed Care - HMO | Source: Ambulatory Visit | Attending: General Surgery | Admitting: General Surgery

## 2015-07-11 ENCOUNTER — Encounter (HOSPITAL_BASED_OUTPATIENT_CLINIC_OR_DEPARTMENT_OTHER)
Admission: RE | Admit: 2015-07-11 | Discharge: 2015-07-11 | Disposition: A | Discharge status: Home / Self Care | Payer: Commercial Managed Care - HMO | Source: Ambulatory Visit | Attending: General Surgery | Admitting: General Surgery

## 2015-07-11 DIAGNOSIS — N63 Unspecified lump in unspecified breast: Secondary | ICD-10-CM

## 2015-07-11 DIAGNOSIS — Z01818 Encounter for other preprocedural examination: Secondary | ICD-10-CM | POA: Insufficient documentation

## 2015-07-11 LAB — BASIC METABOLIC PANEL
ANION GAP: 8 (ref 5–15)
BUN: 9 mg/dL (ref 6–20)
CHLORIDE: 104 mmol/L (ref 101–111)
CO2: 29 mmol/L (ref 22–32)
Calcium: 10.2 mg/dL (ref 8.9–10.3)
Creatinine, Ser: 0.79 mg/dL (ref 0.44–1.00)
GFR calc Af Amer: >60 mL/min (ref >60)
GFR calc non Af Amer: >60 mL/min (ref >60)
Glucose, Bld: 94 mg/dL (ref 65–99)
POTASSIUM: 3.9 mmol/L (ref 3.5–5.1)
SODIUM: 141 mmol/L (ref 135–145)

## 2015-07-11 LAB — CBC WITH DIFFERENTIAL/PLATELET
Basophils Absolute: 0 10*3/uL (ref 0.0–0.1)
Basophils Relative: 1 %
EOS PCT: 2 %
Eosinophils Absolute: 0.1 10*3/uL (ref 0.0–0.7)
HEMATOCRIT: 37.5 % (ref 36.0–46.0)
HEMOGLOBIN: 12.2 g/dL (ref 12.0–15.0)
LYMPHS ABS: 2.1 10*3/uL (ref 0.7–4.0)
LYMPHS PCT: 39 %
MCH: 31.7 pg (ref 26.0–34.0)
MCHC: 32.5 g/dL (ref 30.0–36.0)
MCV: 97.4 fL (ref 78.0–100.0)
Monocytes Absolute: 0.6 10*3/uL (ref 0.1–1.0)
Monocytes Relative: 10 %
NEUTROS ABS: 2.7 10*3/uL (ref 1.7–7.7)
Neutrophils Relative %: 48 %
PLATELETS: 267 10*3/uL (ref 150–400)
RBC: 3.85 MIL/uL — AB (ref 3.87–5.11)
RDW: 14.2 % (ref 11.5–15.5)
WBC: 5.5 10*3/uL (ref 4.0–10.5)

## 2015-07-16 ENCOUNTER — Encounter (HOSPITAL_BASED_OUTPATIENT_CLINIC_OR_DEPARTMENT_OTHER): Payer: Self-pay | Admitting: Anesthesiology

## 2015-07-16 ENCOUNTER — Encounter (HOSPITAL_BASED_OUTPATIENT_CLINIC_OR_DEPARTMENT_OTHER)
Admission: RE | Disposition: A | Discharge status: Home / Self Care | Payer: Self-pay | Source: Ambulatory Visit | Attending: General Surgery

## 2015-07-16 ENCOUNTER — Ambulatory Visit (HOSPITAL_BASED_OUTPATIENT_CLINIC_OR_DEPARTMENT_OTHER): Payer: Commercial Managed Care - HMO | Admitting: Anesthesiology

## 2015-07-16 ENCOUNTER — Ambulatory Visit
Admission: RE | Admit: 2015-07-16 | Discharge: 2015-07-16 | Disposition: A | Discharge status: Home / Self Care | Payer: Commercial Managed Care - HMO | Source: Ambulatory Visit | Attending: General Surgery | Admitting: General Surgery

## 2015-07-16 ENCOUNTER — Ambulatory Visit (HOSPITAL_BASED_OUTPATIENT_CLINIC_OR_DEPARTMENT_OTHER)
Admission: RE | Admit: 2015-07-16 | Discharge: 2015-07-16 | Disposition: A | Discharge status: Home / Self Care | Payer: Commercial Managed Care - HMO | Source: Ambulatory Visit | Attending: General Surgery | Admitting: General Surgery

## 2015-07-16 DIAGNOSIS — Q859 Phakomatosis, unspecified: Secondary | ICD-10-CM | POA: Diagnosis not present

## 2015-07-16 DIAGNOSIS — I10 Essential (primary) hypertension: Secondary | ICD-10-CM | POA: Diagnosis not present

## 2015-07-16 DIAGNOSIS — Z79899 Other long term (current) drug therapy: Secondary | ICD-10-CM | POA: Insufficient documentation

## 2015-07-16 DIAGNOSIS — N63 Unspecified lump in unspecified breast: Secondary | ICD-10-CM

## 2015-07-16 HISTORY — PX: RADIOACTIVE SEED GUIDED EXCISIONAL BREAST BIOPSY: SHX6490

## 2015-07-16 SURGERY — RADIOACTIVE SEED GUIDED BREAST BIOPSY
Anesthesia: General | Site: Breast | Laterality: Right

## 2015-07-16 MED ORDER — PROPOFOL 10 MG/ML IV BOLUS
INTRAVENOUS | Status: AC
  Filled 2015-07-16: qty 20

## 2015-07-16 MED ORDER — LIDOCAINE HCL (CARDIAC) 20 MG/ML IV SOLN
INTRAVENOUS | Status: AC
  Filled 2015-07-16: qty 5

## 2015-07-16 MED ORDER — CEFAZOLIN SODIUM-DEXTROSE 2-3 GM-% IV SOLR
INTRAVENOUS | Status: AC
  Filled 2015-07-16: qty 50

## 2015-07-16 MED ORDER — MIDAZOLAM HCL 2 MG/2ML IJ SOLN: 1.0000 mg | INTRAMUSCULAR | Status: DC | PRN

## 2015-07-16 MED ORDER — LIDOCAINE HCL (CARDIAC) 20 MG/ML IV SOLN
INTRAVENOUS | Status: DC | PRN
  Administered 2015-07-16: 50 mg via INTRAVENOUS

## 2015-07-16 MED ORDER — SCOPOLAMINE 1 MG/3DAYS TD PT72: 1.0000 | MEDICATED_PATCH | Freq: Once | TRANSDERMAL | Status: DC | PRN

## 2015-07-16 MED ORDER — LACTATED RINGERS IV SOLN
INTRAVENOUS | Status: DC
  Administered 2015-07-16: 10:00:00 via INTRAVENOUS

## 2015-07-16 MED ORDER — CEFAZOLIN SODIUM-DEXTROSE 2-3 GM-% IV SOLR
INTRAVENOUS | Status: DC | PRN
  Administered 2015-07-16: 2 g via INTRAVENOUS

## 2015-07-16 MED ORDER — ONDANSETRON HCL 4 MG/2ML IJ SOLN: 4.0000 mg | Freq: Once | INTRAMUSCULAR | Status: DC | PRN

## 2015-07-16 MED ORDER — PROPOFOL 10 MG/ML IV BOLUS
INTRAVENOUS | Status: DC | PRN
  Administered 2015-07-16: 200 mg via INTRAVENOUS
  Administered 2015-07-16: 20 mg via INTRAVENOUS

## 2015-07-16 MED ORDER — DEXAMETHASONE SODIUM PHOSPHATE 10 MG/ML IJ SOLN
INTRAMUSCULAR | Status: AC
  Filled 2015-07-16: qty 1

## 2015-07-16 MED ORDER — FENTANYL CITRATE (PF) 100 MCG/2ML IJ SOLN
INTRAMUSCULAR | Status: DC | PRN
  Administered 2015-07-16: 100 ug via INTRAVENOUS

## 2015-07-16 MED ORDER — MEPERIDINE HCL 25 MG/ML IJ SOLN: 6.2500 mg | INTRAMUSCULAR | Status: DC | PRN

## 2015-07-16 MED ORDER — PROPOFOL 10 MG/ML IV BOLUS
INTRAVENOUS | Status: AC
  Filled 2015-07-16: qty 40

## 2015-07-16 MED ORDER — HYDROMORPHONE HCL 1 MG/ML IJ SOLN: 0.2500 mg | INTRAMUSCULAR | Status: DC | PRN

## 2015-07-16 MED ORDER — FENTANYL CITRATE (PF) 100 MCG/2ML IJ SOLN
INTRAMUSCULAR | Status: AC
  Filled 2015-07-16: qty 2

## 2015-07-16 MED ORDER — FENTANYL CITRATE (PF) 100 MCG/2ML IJ SOLN: 50.0000 ug | INTRAMUSCULAR | Status: DC | PRN

## 2015-07-16 MED ORDER — MIDAZOLAM HCL 5 MG/5ML IJ SOLN
INTRAMUSCULAR | Status: DC | PRN
  Administered 2015-07-16: 2 mg via INTRAVENOUS

## 2015-07-16 MED ORDER — MIDAZOLAM HCL 2 MG/2ML IJ SOLN
INTRAMUSCULAR | Status: AC
  Filled 2015-07-16: qty 2

## 2015-07-16 MED ORDER — OXYCODONE-ACETAMINOPHEN 10-325 MG PO TABS: 1.0000 | ORAL_TABLET | Freq: Four times a day (QID) | ORAL | Status: AC | PRN

## 2015-07-16 MED ORDER — ONDANSETRON HCL 4 MG/2ML IJ SOLN
INTRAMUSCULAR | Status: AC
  Filled 2015-07-16: qty 2

## 2015-07-16 MED ORDER — GLYCOPYRROLATE 0.2 MG/ML IJ SOLN: 0.2000 mg | Freq: Once | INTRAMUSCULAR | Status: DC | PRN

## 2015-07-16 MED ORDER — DEXAMETHASONE SODIUM PHOSPHATE 4 MG/ML IJ SOLN
INTRAMUSCULAR | Status: DC | PRN
Start: 2015-07-16 — End: 2015-07-16
  Administered 2015-07-16: 10 mg via INTRAVENOUS

## 2015-07-16 MED ORDER — BUPIVACAINE HCL (PF) 0.25 % IJ SOLN
INTRAMUSCULAR | Status: DC | PRN
  Administered 2015-07-16: 17 mL

## 2015-07-16 SURGICAL SUPPLY — 43 items
BINDER BREAST XLRG (GAUZE/BANDAGES/DRESSINGS) ×3 IMPLANT
BLADE SURG 15 STRL LF DISP TIS (BLADE) ×1 IMPLANT
BLADE SURG 15 STRL SS (BLADE) ×2
CANISTER SUCTION 1200CC (MISCELLANEOUS) ×3 IMPLANT
CHLORAPREP W/TINT 26ML (MISCELLANEOUS) ×3 IMPLANT
CLOSURE WOUND 1/2 X4 (GAUZE/BANDAGES/DRESSINGS) ×1
COVER BACK TABLE 60X90IN (DRAPES) ×3 IMPLANT
COVER MAYO STAND STRL (DRAPES) ×3 IMPLANT
COVER PROBE W GEL 5X96 (DRAPES) ×3 IMPLANT
DEVICE DUBIN W/COMP PLATE 8390 (MISCELLANEOUS) ×3 IMPLANT
DRAPE LAPAROSCOPIC ABDOMINAL (DRAPES) ×3 IMPLANT
DRAPE UTILITY XL STRL (DRAPES) ×3 IMPLANT
ELECT COATED BLADE 2.86 ST (ELECTRODE) ×3 IMPLANT
ELECT REM PT RETURN 9FT ADLT (ELECTROSURGICAL) ×3
ELECTRODE REM PT RTRN 9FT ADLT (ELECTROSURGICAL) ×1 IMPLANT
GLOVE BIO SURGEON STRL SZ 6.5 (GLOVE) ×2 IMPLANT
GLOVE BIO SURGEON STRL SZ7 (GLOVE) ×6 IMPLANT
GLOVE BIO SURGEONS STRL SZ 6.5 (GLOVE) ×1
GLOVE BIOGEL PI IND STRL 7.0 (GLOVE) ×3 IMPLANT
GLOVE BIOGEL PI IND STRL 7.5 (GLOVE) ×1 IMPLANT
GLOVE BIOGEL PI INDICATOR 7.0 (GLOVE) ×6
GLOVE BIOGEL PI INDICATOR 7.5 (GLOVE) ×2
GLOVE ECLIPSE 6.5 STRL STRAW (GLOVE) ×3 IMPLANT
GOWN STRL REUS W/ TWL LRG LVL3 (GOWN DISPOSABLE) ×3 IMPLANT
GOWN STRL REUS W/TWL LRG LVL3 (GOWN DISPOSABLE) ×6
KIT MARKER MARGIN INK (KITS) ×3 IMPLANT
LIQUID BAND (GAUZE/BANDAGES/DRESSINGS) ×3 IMPLANT
NEEDLE HYPO 25X1 1.5 SAFETY (NEEDLE) ×3 IMPLANT
PACK BASIN DAY SURGERY FS (CUSTOM PROCEDURE TRAY) ×3 IMPLANT
PENCIL BUTTON HOLSTER BLD 10FT (ELECTRODE) ×3 IMPLANT
SLEEVE SCD COMPRESS KNEE MED (MISCELLANEOUS) ×3 IMPLANT
SPONGE LAP 4X18 X RAY DECT (DISPOSABLE) ×6 IMPLANT
STRIP CLOSURE SKIN 1/2X4 (GAUZE/BANDAGES/DRESSINGS) ×2 IMPLANT
SUT MNCRL AB 4-0 PS2 18 (SUTURE) ×3 IMPLANT
SUT VIC AB 2-0 SH 27 (SUTURE) ×2
SUT VIC AB 2-0 SH 27XBRD (SUTURE) ×1 IMPLANT
SUT VIC AB 3-0 SH 27 (SUTURE) ×2
SUT VIC AB 3-0 SH 27X BRD (SUTURE) ×1 IMPLANT
SYR CONTROL 10ML LL (SYRINGE) ×3 IMPLANT
TOWEL OR 17X24 6PK STRL BLUE (TOWEL DISPOSABLE) ×3 IMPLANT
TOWEL OR NON WOVEN STRL DISP B (DISPOSABLE) ×3 IMPLANT
TUBING SUCTION 1/4X6FT (MISCELLANEOUS) ×3 IMPLANT
YANKAUER SUCT BULB TIP NO VENT (SUCTIONS) ×3 IMPLANT

## 2015-07-16 NOTE — Anesthesia Postprocedure Evaluation (Signed)
Anesthesia Post Note  Patient: Kayla Blake  Procedure(s) Performed: Procedure(s) (LRB): RADIOACTIVE SEED GUIDED EXCISIONAL BREAST BIOPSY (Right)  Patient location during evaluation: PACU Anesthesia Type: General Level of consciousness: awake and alert Pain management: pain level controlled Vital Signs Assessment: post-procedure vital signs reviewed and stable Respiratory status: spontaneous breathing, nonlabored ventilation, respiratory function stable and patient connected to nasal cannula oxygen Cardiovascular status: blood pressure returned to baseline and stable Postop Assessment: no signs of nausea or vomiting Anesthetic complications: no    Last Vitals:  Filed Vitals:   07/16/15 1330 07/16/15 1345  BP: 142/81 135/78  Pulse: 61 66  Temp:    Resp: 16 18    Last Pain:  Filed Vitals:   07/16/15 1355  PainSc: 0-No pain                 Kingdom Vanzanten DAVID

## 2015-07-16 NOTE — Interval H&P Note (Signed)
History and Physical Interval Note:  07/16/2015 10:33 AM  Kayla Blake  has presented today for surgery, with the diagnosis of RIGHT BREAST MASS  The various methods of treatment have been discussed with the patient and family. After consideration of risks, benefits and other options for treatment, the patient has consented to  Procedure(s): RADIOACTIVE SEED GUIDED EXCISIONAL BREAST BIOPSY (Right) as a surgical intervention .  The patient's history has been reviewed, patient examined, no change in status, stable for surgery.  I have reviewed the patient's chart and labs.  Questions were answered to the patient's satisfaction.     Kayla Blake

## 2015-07-16 NOTE — Transfer of Care (Signed)
2Immediate Anesthesia Transfer of Care Note  Patient: Kayla Blake  Procedure(s) Performed: Procedure(s): RADIOACTIVE SEED GUIDED EXCISIONAL BREAST BIOPSY (Right)  Patient Location: PACU  Anesthesia Type:General  Level of Consciousness: awake and patient cooperative  Airway & Oxygen Therapy: Patient Spontanous Breathing and Patient connected to face mask oxygen  Post-op Assessment: Report given to RN and Post -op Vital signs reviewed and stable  Post vital signs: Reviewed and stable  Last Vitals:  Filed Vitals:   07/16/15 0959  BP: 142/81  Pulse: 60  Temp: 36.9 C  Resp: 16    Complications: No apparent anesthesia complications

## 2015-07-16 NOTE — Anesthesia Procedure Notes (Signed)
Procedure Name: LMA Insertion Date/Time: 07/16/2015 12:15 PM Performed by: Toula Moos L Pre-anesthesia Checklist: Patient identified, Emergency Drugs available, Suction available, Patient being monitored and Timeout performed Patient Re-evaluated:Patient Re-evaluated prior to inductionOxygen Delivery Method: Circle System Utilized Preoxygenation: Pre-oxygenation with 100% oxygen Intubation Type: IV induction Ventilation: Mask ventilation without difficulty LMA: LMA inserted LMA Size: 4.0 Number of attempts: 1 Airway Equipment and Method: Bite block Placement Confirmation: positive ETCO2 Tube secured with: Tape Dental Injury: Teeth and Oropharynx as per pre-operative assessment

## 2015-07-16 NOTE — Op Note (Signed)
Preoperative diagnosis: Right breast mass on mm, core biopsy with biphasic lesion Postoperative diagnosis: same as above Procedure: Right breast mass seed guided excisional biopsy Surgeon: Dr Serita Grammes Anesthesia: general EBL: minimal Drains none Complications none Specimen right breast tissue marked with paint Sponge and needle count was correct to completion Suspicion to recovery stable  Indications: This is a 74 yof who has right breast mass on mm and core shows biphasic lesion We discussed options. She and I discussed seed guided excision of this mass. She had a seed placed prior to beginning and I had her mm in the OR.   Procedure: After informed consent was obtained the patient was taken to the operating room. She was given antibiotics. Sequential compression devices were on her legs. She was then placed under general anesthesia without complication. She was prepped and draped in the standard sterile surgical fashion. A surgical timeout was then performed.  I infiltrated 1% lidocaine and made a lateral incision after locating the seed. I then removed the seed and the surrounding tissue. Hemostasis was observed. I painted the specimen. Mammogram confirmed removal of the seed and clip. I then closed this with 2-0 Vicryl, 3-0 Vicryl, and 4-0 Monocryl. Glue was placed over the wound.She tolerated this well was extubated and transferred to recovery in stable condition

## 2015-07-16 NOTE — Anesthesia Preprocedure Evaluation (Signed)
Anesthesia Evaluation  Patient identified by MRN, date of birth, ID band Patient awake    Reviewed: Allergy & Precautions, NPO status , Patient's Chart, lab work & pertinent test results  Airway Mallampati: I  TM Distance: >3 FB Neck ROM: Full    Dental   Pulmonary    Pulmonary exam normal        Cardiovascular hypertension, Pt. on medications Normal cardiovascular exam     Neuro/Psych    GI/Hepatic   Endo/Other    Renal/GU      Musculoskeletal   Abdominal   Peds  Hematology   Anesthesia Other Findings   Reproductive/Obstetrics                             Anesthesia Physical Anesthesia Plan  ASA: II  Anesthesia Plan: General   Post-op Pain Management:    Induction: Intravenous  Airway Management Planned: LMA  Additional Equipment:   Intra-op Plan:   Post-operative Plan: Extubation in OR  Informed Consent: I have reviewed the patients History and Physical, chart, labs and discussed the procedure including the risks, benefits and alternatives for the proposed anesthesia with the patient or authorized representative who has indicated his/her understanding and acceptance.     Plan Discussed with: CRNA and Surgeon  Anesthesia Plan Comments:         Anesthesia Quick Evaluation  

## 2015-07-16 NOTE — H&P (Signed)
15 yof s/p right foot surgery in october who presents after undergoing routine screening mm that showed density b breasts and a right breast mass. on [place] there is a 7x6x5 mm mass at 830 oclock position 6 cm from nipple. this underwent US guided biopsy with clip placement showing a biphasic lesion with differential being lg phyllodes, hamartoma of fa. she has no complaints referable to either breast and no concerning personal history. she does have fh in sister in 31s and gm with breast cancer.   Other Problems Yehuda Mao, RMA; 07/01/2015 9:21 AM) Heart murmur High blood pressure Lump In Breast  Past Surgical History Yehuda Mao, RMA; 07/01/2015 9:21 AM) Breast Biopsy Right. Foot Surgery Right. Gallbladder Surgery - Open  Diagnostic Studies History Yehuda Mao, RMA; 07/01/2015 9:21 AM) Colonoscopy 1-5 years ago Mammogram within last year Pap Smear 1-5 years ago  Allergies Shirlean Mylar Gwynn, RMA; 07/01/2015 9:22 AM) No Known Drug Allergies11/29/2016  Medication History (Robin Gwynn, RMA; 07/01/2015 9:22 AM) AmLODIPine Besylate (5MG  Tablet, Oral) Active. EQ Aspirin (325MG  Tablet, Oral) Active. PARoxetine HCl (20MG  Tablet, Oral) Active. HydroCHLOROthiazide (25MG  Tablet, Oral) Active. Medications Reconciled  Social History Yehuda Mao, RMA; 07/01/2015 9:21 AM) Alcohol use Heavy alcohol use. Caffeine use Coffee. No drug use Tobacco use Never smoker.  Family History Yehuda Mao, RMA; 07/01/2015 9:21 AM) Alcohol Abuse Father, Mother, Sister. Breast Cancer Sister. Depression Sister. Diabetes Mellitus Family Members In General, Mother. Hypertension Mother, Sister.  Pregnancy / Birth History Yehuda Mao, RMA; 07/01/2015 9:21 AM) Age at menarche 47 years. Contraceptive History Oral contraceptives. Gravida 4 Irregular periods Maternal age 54-25 Para 2    Review of Systems Shirlean Mylar Gwynn RMA; 07/01/2015 9:21 AM) General Not Present- Appetite  Loss, Chills, Fatigue, Fever, Night Sweats, Weight Gain and Weight Loss. Skin Not Present- Change in Wart/Mole, Dryness, Hives, Jaundice, New Lesions, Non-Healing Wounds, Rash and Ulcer. HEENT Present- Seasonal Allergies and Wears glasses/contact lenses. Not Present- Earache, Hearing Loss, Hoarseness, Nose Bleed, Oral Ulcers, Ringing in the Ears, Sinus Pain, Sore Throat, Visual Disturbances and Yellow Eyes. Respiratory Not Present- Bloody sputum, Chronic Cough, Difficulty Breathing, Snoring and Wheezing. Breast Present- Breast Mass. Not Present- Breast Pain, Nipple Discharge and Skin Changes. Cardiovascular Not Present- Chest Pain, Difficulty Breathing Lying Down, Leg Cramps, Palpitations, Rapid Heart Rate, Shortness of Breath and Swelling of Extremities. Gastrointestinal Not Present- Abdominal Pain, Bloating, Bloody Stool, Change in Bowel Habits, Chronic diarrhea, Constipation, Difficulty Swallowing, Excessive gas, Gets full quickly at meals, Hemorrhoids, Indigestion, Nausea, Rectal Pain and Vomiting. Female Genitourinary Not Present- Frequency, Nocturia, Painful Urination, Pelvic Pain and Urgency. Musculoskeletal Not Present- Back Pain, Joint Pain, Joint Stiffness, Muscle Pain, Muscle Weakness and Swelling of Extremities. Neurological Not Present- Decreased Memory, Fainting, Headaches, Numbness, Seizures, Tingling, Tremor, Trouble walking and Weakness. Psychiatric Not Present- Anxiety, Bipolar, Change in Sleep Pattern, Depression, Fearful and Frequent crying. Endocrine Present- Hot flashes. Not Present- Cold Intolerance, Excessive Hunger, Hair Changes, Heat Intolerance and New Diabetes.  Vitals (Robin Gwynn RMA; 07/01/2015 9:23 AM) 07/01/2015 9:22 AM Weight: 182 lb Height: 65.5in Body Surface Area: 1.91 m Body Mass Index: 29.83 kg/m  Pulse: 64 (Regular)  BP: 136/74 (Sitting, Left Arm, Standard)       Physical Exam Rolm Bookbinder MD; 07/01/2015 9:46 AM) General Mental  Status-Alert. Orientation-Oriented X3.  Chest and Lung Exam Chest and lung exam reveals -on auscultation, normal breath sounds, no adventitious sounds and normal vocal resonance.  Breast Nipples-No Discharge.  Cardiovascular Cardiovascular examination reveals -normal heart sounds, regular rate and rhythm with  no murmurs.  Lymphatic Head & Neck  General Head & Neck Lymphatics: Bilateral - Description - Normal. Axillary  General Axillary Region: Bilateral - Description - Normal. Note: no [place] adenopathy     Assessment & Plan Rolm Bookbinder MD; 07/01/2015 9:56 AM) BREAST MASS IN FEMALE (N63) Story: right breast mass seed guided excisional biopsy discussed indications, procedure and recovery. will plan on seed guided excision rule out phyllodes tumor.

## 2015-07-16 NOTE — Discharge Instructions (Signed)
[facility] °Office Phone Number [phone] ° °POST OP INSTRUCTIONS ° °Always review your discharge instruction sheet given to you by the facility where your surgery was performed. ° °IF YOU HAVE DISABILITY OR FAMILY LEAVE FORMS, YOU MUST BRING THEM TO THE OFFICE FOR PROCESSING.  DO NOT GIVE THEM TO YOUR DOCTOR. ° °1. A prescription for pain medication may be given to you upon discharge.  Take your pain medication as prescribed, if needed.  If narcotic pain medicine is not needed, then you may take acetaminophen (Tylenol), naprosyn (Alleve) or ibuprofen (Advil) as needed. °2. Take your usually prescribed medications unless otherwise directed °3. If you need a refill on your pain medication, please contact your pharmacy.  They will contact our office to request authorization.  Prescriptions will not be filled after 5pm or on week-ends. °4. You should eat very light the first 24 hours after surgery, such as soup, crackers, pudding, etc.  Resume your normal diet the day after surgery. °5. Most patients will experience some swelling and bruising in the breast.  Ice packs and a good support bra will help.  Wear the breast binder provided or a sports bra for 72 hours day and night.  After that wear a sports bra during the day until you return to the office. Swelling and bruising can take several days to resolve.  °6. It is common to experience some constipation if taking pain medication after surgery.  Increasing fluid intake and taking a stool softener will usually help or prevent this problem from occurring.  A mild laxative (Milk of Magnesia or Miralax) should be taken according to package directions if there are no bowel movements after 48 hours. °7. Unless discharge instructions indicate otherwise, you may remove your bandages 48 hours after surgery and you may shower at that time.  You may have steri-strips (small skin tapes) in place directly over the incision.  These strips should be left on the  skin for 7-10 days and will come off on their own.  If your surgeon used skin glue on the incision, you may shower in 24 hours.  The glue will flake off over the next 2-3 weeks.  Any sutures or staples will be removed at the office during your follow-up visit. °8. ACTIVITIES:  You may resume regular daily activities (gradually increasing) beginning the next day.  Wearing a good support bra or sports bra minimizes pain and swelling.  You may have sexual intercourse when it is comfortable. °a. You may drive when you no longer are taking prescription pain medication, you can comfortably wear a seatbelt, and you can safely maneuver your car and apply brakes. °b. RETURN TO WORK:  ______________________________________________________________________________________ °9. You should see your doctor in the office for a follow-up appointment approximately two weeks after your surgery.  Your doctor’s nurse will typically make your follow-up appointment when she calls you with your pathology report.  Expect your pathology report 3-4 business days after your surgery.  You may call to check if you do not hear from us after three days. °10. OTHER INSTRUCTIONS: _______________________________________________________________________________________________ _____________________________________________________________________________________________________________________________________ °_____________________________________________________________________________________________________________________________________ °_____________________________________________________________________________________________________________________________________ ° °WHEN TO CALL DR WAKEFIELD: °1. Fever over 101.0 °2. Nausea and/or vomiting. °3. Extreme swelling or bruising. °4. Continued bleeding from incision. °5. Increased pain, redness, or drainage from the incision. ° °The clinic staff is available to answer your questions during regular  business hours.  Please don’t hesitate to call and ask to speak to one of the nurses for clinical concerns.  If   you have a medical emergency, go to the nearest emergency room or call 911.  A surgeon from [facility] is always on call at the hospital. ° °For further questions, please visit [email or web address] ° ° ° °Post Anesthesia Home Care Instructions ° °Activity: °Get plenty of rest for the remainder of the day. A responsible adult should stay with you for 24 hours following the procedure.  °For the next 24 hours, DO NOT: °-Drive a car °-Operate machinery °-Drink alcoholic beverages °-Take any medication unless instructed by your physician °-Make any legal decisions or sign important papers. ° °Meals: °Start with liquid foods such as gelatin or soup. Progress to regular foods as tolerated. Avoid greasy, spicy, heavy foods. If nausea and/or vomiting occur, drink only clear liquids until the nausea and/or vomiting subsides. Call your physician if vomiting continues. ° °Special Instructions/Symptoms: °Your throat may feel dry or sore from the anesthesia or the breathing tube placed in your throat during surgery. If this causes discomfort, gargle with warm salt water. The discomfort should disappear within 24 hours. ° °If you had a scopolamine patch placed behind your ear for the management of post- operative nausea and/or vomiting: ° °1. The medication in the patch is effective for 72 hours, after which it should be removed.  Wrap patch in a tissue and discard in the trash. Wash hands thoroughly with soap and water. °2. You may remove the patch earlier than 72 hours if you experience unpleasant side effects which may include dry mouth, dizziness or visual disturbances. °3. Avoid touching the patch. Wash your hands with soap and water after contact with the patch. °  ° °

## 2015-07-17 ENCOUNTER — Encounter (HOSPITAL_BASED_OUTPATIENT_CLINIC_OR_DEPARTMENT_OTHER): Payer: Self-pay | Admitting: General Surgery

## 2015-08-06 ENCOUNTER — Other Ambulatory Visit: Payer: Self-pay | Admitting: General Surgery

## 2015-08-06 DIAGNOSIS — N63 Unspecified lump in unspecified breast: Secondary | ICD-10-CM

## 2015-08-07 ENCOUNTER — Other Ambulatory Visit: Payer: Self-pay | Admitting: General Surgery

## 2015-08-07 DIAGNOSIS — N63 Unspecified lump in unspecified breast: Secondary | ICD-10-CM

## 2015-08-12 ENCOUNTER — Other Ambulatory Visit: Payer: Commercial Managed Care - HMO

## 2015-08-14 ENCOUNTER — Ambulatory Visit
Admission: RE | Admit: 2015-08-14 | Discharge: 2015-08-14 | Disposition: A | Discharge status: Home / Self Care | Payer: Commercial Managed Care - HMO | Source: Ambulatory Visit | Attending: General Surgery | Admitting: General Surgery

## 2015-08-14 DIAGNOSIS — N63 Unspecified lump in unspecified breast: Secondary | ICD-10-CM

## 2015-10-13 ENCOUNTER — Emergency Department (HOSPITAL_COMMUNITY)
Admission: EM | Admit: 2015-10-13 | Discharge: 2015-10-13 | Disposition: A | Discharge status: Home / Self Care | Payer: Commercial Managed Care - HMO | Attending: Emergency Medicine | Admitting: Emergency Medicine

## 2015-10-13 ENCOUNTER — Emergency Department (HOSPITAL_COMMUNITY): Payer: Commercial Managed Care - HMO

## 2015-10-13 ENCOUNTER — Encounter (HOSPITAL_COMMUNITY): Payer: Self-pay | Admitting: Emergency Medicine

## 2015-10-13 DIAGNOSIS — R55 Syncope and collapse: Secondary | ICD-10-CM | POA: Insufficient documentation

## 2015-10-13 DIAGNOSIS — Z8639 Personal history of other endocrine, nutritional and metabolic disease: Secondary | ICD-10-CM | POA: Insufficient documentation

## 2015-10-13 DIAGNOSIS — Z79899 Other long term (current) drug therapy: Secondary | ICD-10-CM | POA: Diagnosis not present

## 2015-10-13 DIAGNOSIS — R011 Cardiac murmur, unspecified: Secondary | ICD-10-CM | POA: Insufficient documentation

## 2015-10-13 DIAGNOSIS — F419 Anxiety disorder, unspecified: Secondary | ICD-10-CM | POA: Insufficient documentation

## 2015-10-13 DIAGNOSIS — I10 Essential (primary) hypertension: Secondary | ICD-10-CM | POA: Insufficient documentation

## 2015-10-13 DIAGNOSIS — F411 Generalized anxiety disorder: Secondary | ICD-10-CM

## 2015-10-13 LAB — CBC
HEMATOCRIT: 40.7 % (ref 36.0–46.0)
Hemoglobin: 13.6 g/dL (ref 12.0–15.0)
MCH: 31.1 pg (ref 26.0–34.0)
MCHC: 33.4 g/dL (ref 30.0–36.0)
MCV: 93.1 fL (ref 78.0–100.0)
Platelets: 233 10*3/uL (ref 150–400)
RBC: 4.37 MIL/uL (ref 3.87–5.11)
RDW: 13.7 % (ref 11.5–15.5)
WBC: 6.2 10*3/uL (ref 4.0–10.5)

## 2015-10-13 LAB — URINALYSIS, ROUTINE W REFLEX MICROSCOPIC
GLUCOSE, UA: NEGATIVE mg/dL
HGB URINE DIPSTICK: NEGATIVE
KETONES UR: 15 mg/dL — AB
Nitrite: NEGATIVE
PH: 7.5 (ref 5.0–8.0)
Specific Gravity, Urine: 1.025 (ref 1.005–1.030)

## 2015-10-13 LAB — BASIC METABOLIC PANEL
Anion gap: 18 — ABNORMAL HIGH (ref 5–15)
Anion gap: 15 (ref 5–15)
BUN: 10 mg/dL (ref 6–20)
BUN: 10 mg/dL (ref 6–20)
CALCIUM: 9.4 mg/dL (ref 8.9–10.3)
CHLORIDE: 100 mmol/L — AB (ref 101–111)
CHLORIDE: 104 mmol/L (ref 101–111)
CO2: 26 mmol/L (ref 22–32)
CO2: 24 mmol/L (ref 22–32)
CREATININE: 0.85 mg/dL (ref 0.44–1.00)
Calcium: 10.2 mg/dL (ref 8.9–10.3)
Creatinine, Ser: 0.94 mg/dL (ref 0.44–1.00)
GFR calc non Af Amer: >60 mL/min (ref >60)
GFR calc non Af Amer: >60 mL/min (ref >60)
Glucose, Bld: 97 mg/dL (ref 65–99)
Glucose, Bld: 88 mg/dL (ref 65–99)
POTASSIUM: 3.9 mmol/L (ref 3.5–5.1)
Potassium: 3.1 mmol/L — ABNORMAL LOW (ref 3.5–5.1)
SODIUM: 144 mmol/L (ref 135–145)
SODIUM: 143 mmol/L (ref 135–145)

## 2015-10-13 LAB — I-STAT TROPONIN, ED
TROPONIN I, POC: 0 ng/mL (ref 0.00–0.08)
TROPONIN I, POC: 0 ng/mL (ref 0.00–0.08)

## 2015-10-13 LAB — URINE MICROSCOPIC-ADD ON

## 2015-10-13 MED ORDER — POTASSIUM CHLORIDE CRYS ER 20 MEQ PO TBCR
40.0000 meq | EXTENDED_RELEASE_TABLET | Freq: Once | ORAL | Status: AC
  Administered 2015-10-13: 40 meq via ORAL
  Filled 2015-10-13: qty 2

## 2015-10-13 MED ORDER — SODIUM CHLORIDE 0.9 % IV BOLUS (SEPSIS)
1000.0000 mL | Freq: Once | INTRAVENOUS | Status: AC
  Administered 2015-10-13: 1000 mL via INTRAVENOUS

## 2015-10-13 NOTE — Discharge Instructions (Signed)
Your tests were all normal today. I suspect anxiety had a lot to do with your symptoms today. Your symptoms do not appear to be related to your heart or your lungs. Please follow up with your primary care provider within one week. Return to the ER for new or worsening symptoms.

## 2015-10-13 NOTE — ED Provider Notes (Signed)
CSN: [id number]     Arrival date & time 10/13/15  1132 History   First MD Initiated Contact with Patient 10/13/15 1400     Chief Complaint  Patient presents with  . Near Syncope    HPI   Kayla Blake is an 50 y.o. female who presents to the ED for evaluation of dizziness. Pt states she was in her usual state of health until today when she was at the gym. She states she did her typical 30 min of stationary bicycle. She states after the bicycle she rested a few minutes and then got in the whirlpool. She states she was in the whirlpool for 5-10 min when she began feeling dizzy/lightheaded. She reports associated tingling/numbness around her lips and a heaviness in her arms. She reports associated chest tightness. Denies complete loss of consciousness. She states she then got anxious and started breathing fast. Her symptoms apparently subsided by the time EMS arrived. Pt states a similar episode occurred while she was waiting in the ED and lasted for less than a minute.  In the ED pt states she feels tired though almost back to baseline. She does report intermittent chest tightness. Denies SOB. Denies numbness or weakness now.  Past Medical History  Diagnosis Date  . Alcohol abuse   . Allergic rhinitis   . Heart murmur   . Hypertension   . Uterine fibroids affecting pregnancy   . Vitamin D deficiency    Past Surgical History  Procedure Laterality Date  . Cholecystectomy    . Tubal ligation    . Orif ankle fracture Right 05/15/2015    Procedure: OPEN REDUCTION INTERNAL FIXATION (ORIF) ANKLE FRACTURE;  Surgeon: Newt Minion, MD;  Location: [facility];  Service: Orthopedics;  Laterality: Right;  . Radioactive seed guided excisional breast biopsy Right 07/16/2015    Procedure: RADIOACTIVE SEED GUIDED EXCISIONAL BREAST BIOPSY;  Surgeon: Rolm Bookbinder, MD;  Location: [facility];  Service: General;  Laterality: Right;   Family History  Problem Relation Age of Onset  . Breast cancer  Sister   . Colon cancer Neg Hx    Social History  Substance Use Topics  . Smoking status: Never Smoker   . Smokeless tobacco: Never Used  . Alcohol Use: 10.4 oz/week    14 Cans of beer, 4 Standard drinks or equivalent per week     Comment: on weekends    OB History    No data available     Review of Systems  All other systems reviewed and are negative.     Allergies  Review of patient's allergies indicates no known allergies.  Home Medications   Prior to Admission medications   Medication Sig Start Date End Date Taking? Authorizing Provider  amLODipine (NORVASC) 5 MG tablet Take 5 mg by mouth daily. 05/06/15   Historical Provider, MD  hydrochlorothiazide (HYDRODIURIL) 25 MG tablet Take 25 mg by mouth daily. 05/06/15   Historical Provider, MD  Multiple Vitamins-Minerals (MULTIVITAMINS THER. W/MINERALS) TABS tablet Take 1 tablet by mouth daily.    Historical Provider, MD  oxyCODONE-acetaminophen (PERCOCET) 10-325 MG tablet Take 1 tablet by mouth every 6 (six) hours as needed for pain. 07/16/15 07/15/16  Rolm Bookbinder, MD  PARoxetine (PAXIL) 20 MG tablet Take 20 mg by mouth daily. 04/27/15   Historical Provider, MD   BP 162/93 mmHg  Pulse 77  Temp(Src) 97.6 F (36.4 C)  Resp 26  SpO2 100%  LMP 09/03/2015 Physical Exam  Constitutional: She is oriented  to person, place, and time.  HENT:  Right Ear: External ear normal.  Left Ear: External ear normal.  Nose: Nose normal.  Mouth/Throat: Oropharynx is clear and moist. No oropharyngeal exudate.  Eyes: Conjunctivae and EOM are normal. Pupils are equal, round, and reactive to light.  No nystagmus  Neck: Normal range of motion. Neck supple.  Cardiovascular: Normal rate, regular rhythm, normal heart sounds and intact distal pulses.   Pulmonary/Chest: Effort normal and breath sounds normal. No respiratory distress. She has no wheezes. She exhibits no tenderness.  Abdominal: Soft. Bowel sounds are normal. She exhibits no  distension. There is no tenderness. There is no rebound and no guarding.  Musculoskeletal: She exhibits no edema.  Neurological: She is alert and oriented to person, place, and time. No cranial nerve deficit.  UE and LE strength and sensation intact. Normal finger to nose. No pronator drift.   Skin: Skin is warm and dry.  Psychiatric: Her mood appears anxious.  Nursing note and vitals reviewed.  Filed Vitals:   10/13/15 1400 10/13/15 1430 10/13/15 1530  BP: 162/93 147/86 152/84  Pulse: 77 75 60  Temp: 97.6 F (36.4 C)    Resp: 26 20 20   SpO2: 100% 100% 100%     ED Course  Procedures (including critical care time) Labs Review Labs Reviewed  BASIC METABOLIC PANEL - Abnormal; Notable for the following:    Chloride 100 (*)    Anion gap 18 (*)    All other components within normal limits  URINALYSIS, ROUTINE W REFLEX MICROSCOPIC (NOT AT [facility]) - Abnormal; Notable for the following:    Color, Urine AMBER (*)    APPearance HAZY (*)    Bilirubin Urine SMALL (*)    Ketones, ur 15 (*)    Protein, ur >300 (*)    Leukocytes, UA TRACE (*)    All other components within normal limits  URINE MICROSCOPIC-ADD ON - Abnormal; Notable for the following:    Squamous Epithelial / LPF 0-5 (*)    Bacteria, UA FEW (*)    Casts GRANULAR CAST (*)    All other components within normal limits  BASIC METABOLIC PANEL - Abnormal; Notable for the following:    Potassium 3.1 (*)    All other components within normal limits  CBC  I-STAT TROPOININ, ED  Randolm Idol, ED    Imaging Review Dg Chest 2 View  10/13/2015  CLINICAL DATA:  Dizziness and hyperventilating.  Hypertension. EXAM: CHEST  2 VIEW COMPARISON:  May 15, 2015 FINDINGS: Lungs are clear. Heart size and pulmonary vascularity are normal. No adenopathy. There is degenerative change in the mid thoracic spine region. There are surgical clips in the right upper quadrant of the abdomen. IMPRESSION: No edema or consolidation. Electronically  Signed   By: Lowella Grip III M.D.   On: 10/13/2015 15:15   I have personally reviewed and evaluated these images and lab results as part of my medical decision-making.   EKG Interpretation   Date/Time:  Monday October 13 2015 14:27:22 EDT Ventricular Rate:  64 PR Interval:  157 QRS Duration: 95 QT Interval:  427 QTC Calculation: 441 R Axis:   25 Text Interpretation:  Sinus rhythm No significant change since last  tracing Confirmed by Winfred Leeds  MD, Ashlee Bewley [phone]) on 10/13/2015 2:33:19 PM      MDM   Final diagnoses:  Near syncope  Anxiety state   Initial workup including EKG, troponin, CXR, cbc, and cmp negative. I suspect a large component of  anxiety causing pt's symptoms. Her chest tightness has now resolved on its own. SHe is breathing comfortably with no hypoxia or tachypnea. She feels better after 1L NS bolus. Orthostatics pending. Unclear if pt has had a true pre-syncopal episode. I will get a delta troponin. HEART score 2. Low suspicion for ACS. Wells 0 and I doubt PE. If delta troponin remains negative and pt remains asymptomatic with stable vital signs she may be discharged home with outpatient f/u.  Delta trop negative. Pt remains asymptomatic. She is able to ambulate unassisted with steady gait. Once BMP results with improved anion gap will d/c home.  Gap improved. Pt d/ced home as above. Instructed PCP f/u. ER return precautions given.   Anne Ng, PA-C 10/14/15 [place], MD 10/15/15 [phone]

## 2015-10-13 NOTE — ED Notes (Signed)
Ambulated in hallway and bathroom steady gait.

## 2015-10-13 NOTE — ED Notes (Signed)
Pt arrives via EMs from [facility] where patient was running on the treadmill then went to whirlpool where pt began to feel lightheaded and dizzy. Pt began hyperventilating, became numb all over. Symptoms resided upon EMS arrival. Pt awake, alert, oriented x4, NAD. CBG 130 PTA. No neuro deficits noted.

## 2015-10-13 NOTE — ED Notes (Signed)
Notified provider of patient symptoms.

## 2015-10-13 NOTE — ED Provider Notes (Signed)
Patient became lightheaded approximate 10 minutes after exercising earlier today the [employer]. She also developed vague chest discomfort, anteriorly and tingling in her fingers and around her lips episode lasted 15 or 20 minutes. She had a second episode of chest discomfort lasting a prostate 10 minutes while in the waiting room here. She is presently asymptomatic. She denies any shortness of breath nausea or sweatiness.   Orlie Dakin, MD 10/13/15 1714

## 2015-10-28 ENCOUNTER — Telehealth (HOSPITAL_COMMUNITY): Payer: Self-pay | Admitting: *Deleted

## 2015-11-14 ENCOUNTER — Other Ambulatory Visit (HOSPITAL_COMMUNITY): Payer: Self-pay | Admitting: Family Medicine

## 2015-11-14 DIAGNOSIS — R55 Syncope and collapse: Secondary | ICD-10-CM

## 2015-11-21 ENCOUNTER — Telehealth: Payer: Self-pay

## 2015-11-21 NOTE — Telephone Encounter (Signed)
LEFT MESSAGE ON PATIENTS VOICEMAIL TO VERIFY PATIENTS INSURANCE

## 2015-11-28 ENCOUNTER — Other Ambulatory Visit (HOSPITAL_COMMUNITY): Payer: Commercial Managed Care - HMO

## 2015-12-01 ENCOUNTER — Telehealth (HOSPITAL_COMMUNITY): Payer: Self-pay | Admitting: *Deleted

## 2015-12-01 NOTE — Telephone Encounter (Signed)
Attempted to reach patient to remind of stress echo on 12/03/15 and give instruction but voice mailbox has not been set up.

## 2015-12-03 ENCOUNTER — Ambulatory Visit (HOSPITAL_BASED_OUTPATIENT_CLINIC_OR_DEPARTMENT_OTHER): Payer: BLUE CROSS/BLUE SHIELD

## 2015-12-03 ENCOUNTER — Ambulatory Visit (HOSPITAL_COMMUNITY): Payer: BLUE CROSS/BLUE SHIELD | Attending: Internal Medicine

## 2015-12-03 DIAGNOSIS — R0989 Other specified symptoms and signs involving the circulatory and respiratory systems: Secondary | ICD-10-CM

## 2015-12-03 DIAGNOSIS — R55 Syncope and collapse: Secondary | ICD-10-CM | POA: Insufficient documentation

## 2016-01-08 ENCOUNTER — Other Ambulatory Visit (HOSPITAL_COMMUNITY)
Admission: RE | Admit: 2016-01-08 | Discharge: 2016-01-08 | Disposition: A | Discharge status: Home / Self Care | Payer: BLUE CROSS/BLUE SHIELD | Source: Ambulatory Visit | Attending: Family Medicine | Admitting: Family Medicine

## 2016-01-08 ENCOUNTER — Other Ambulatory Visit: Payer: Self-pay | Admitting: Family Medicine

## 2016-01-08 DIAGNOSIS — Z1151 Encounter for screening for human papillomavirus (HPV): Secondary | ICD-10-CM | POA: Diagnosis not present

## 2016-01-08 DIAGNOSIS — Z01411 Encounter for gynecological examination (general) (routine) with abnormal findings: Secondary | ICD-10-CM | POA: Insufficient documentation

## 2016-01-13 LAB — CYTOLOGY - PAP

## 2016-04-16 LAB — GLUCOSE, POCT (MANUAL RESULT ENTRY): POC GLUCOSE: 90 mg/dL (ref 70–99)

## 2016-05-11 ENCOUNTER — Other Ambulatory Visit: Payer: Self-pay | Admitting: Family Medicine

## 2016-05-11 DIAGNOSIS — Z1231 Encounter for screening mammogram for malignant neoplasm of breast: Secondary | ICD-10-CM

## 2016-06-22 ENCOUNTER — Ambulatory Visit: Payer: BLUE CROSS/BLUE SHIELD

## 2016-06-23 ENCOUNTER — Ambulatory Visit
Admission: RE | Admit: 2016-06-23 | Discharge: 2016-06-23 | Disposition: A | Discharge status: Home / Self Care | Source: Ambulatory Visit | Attending: Family Medicine | Admitting: Family Medicine

## 2016-06-23 DIAGNOSIS — Z1231 Encounter for screening mammogram for malignant neoplasm of breast: Secondary | ICD-10-CM

## 2016-11-21 IMAGING — DX DG ANKLE COMPLETE 3+V*R*
3 series · 3 of 3 positions shown · non-contrast
Comparison: None.

CLINICAL DATA: Status post fall.

EXAM:
RIGHT ANKLE - COMPLETE 3+ VIEW

[x ankle lat right]
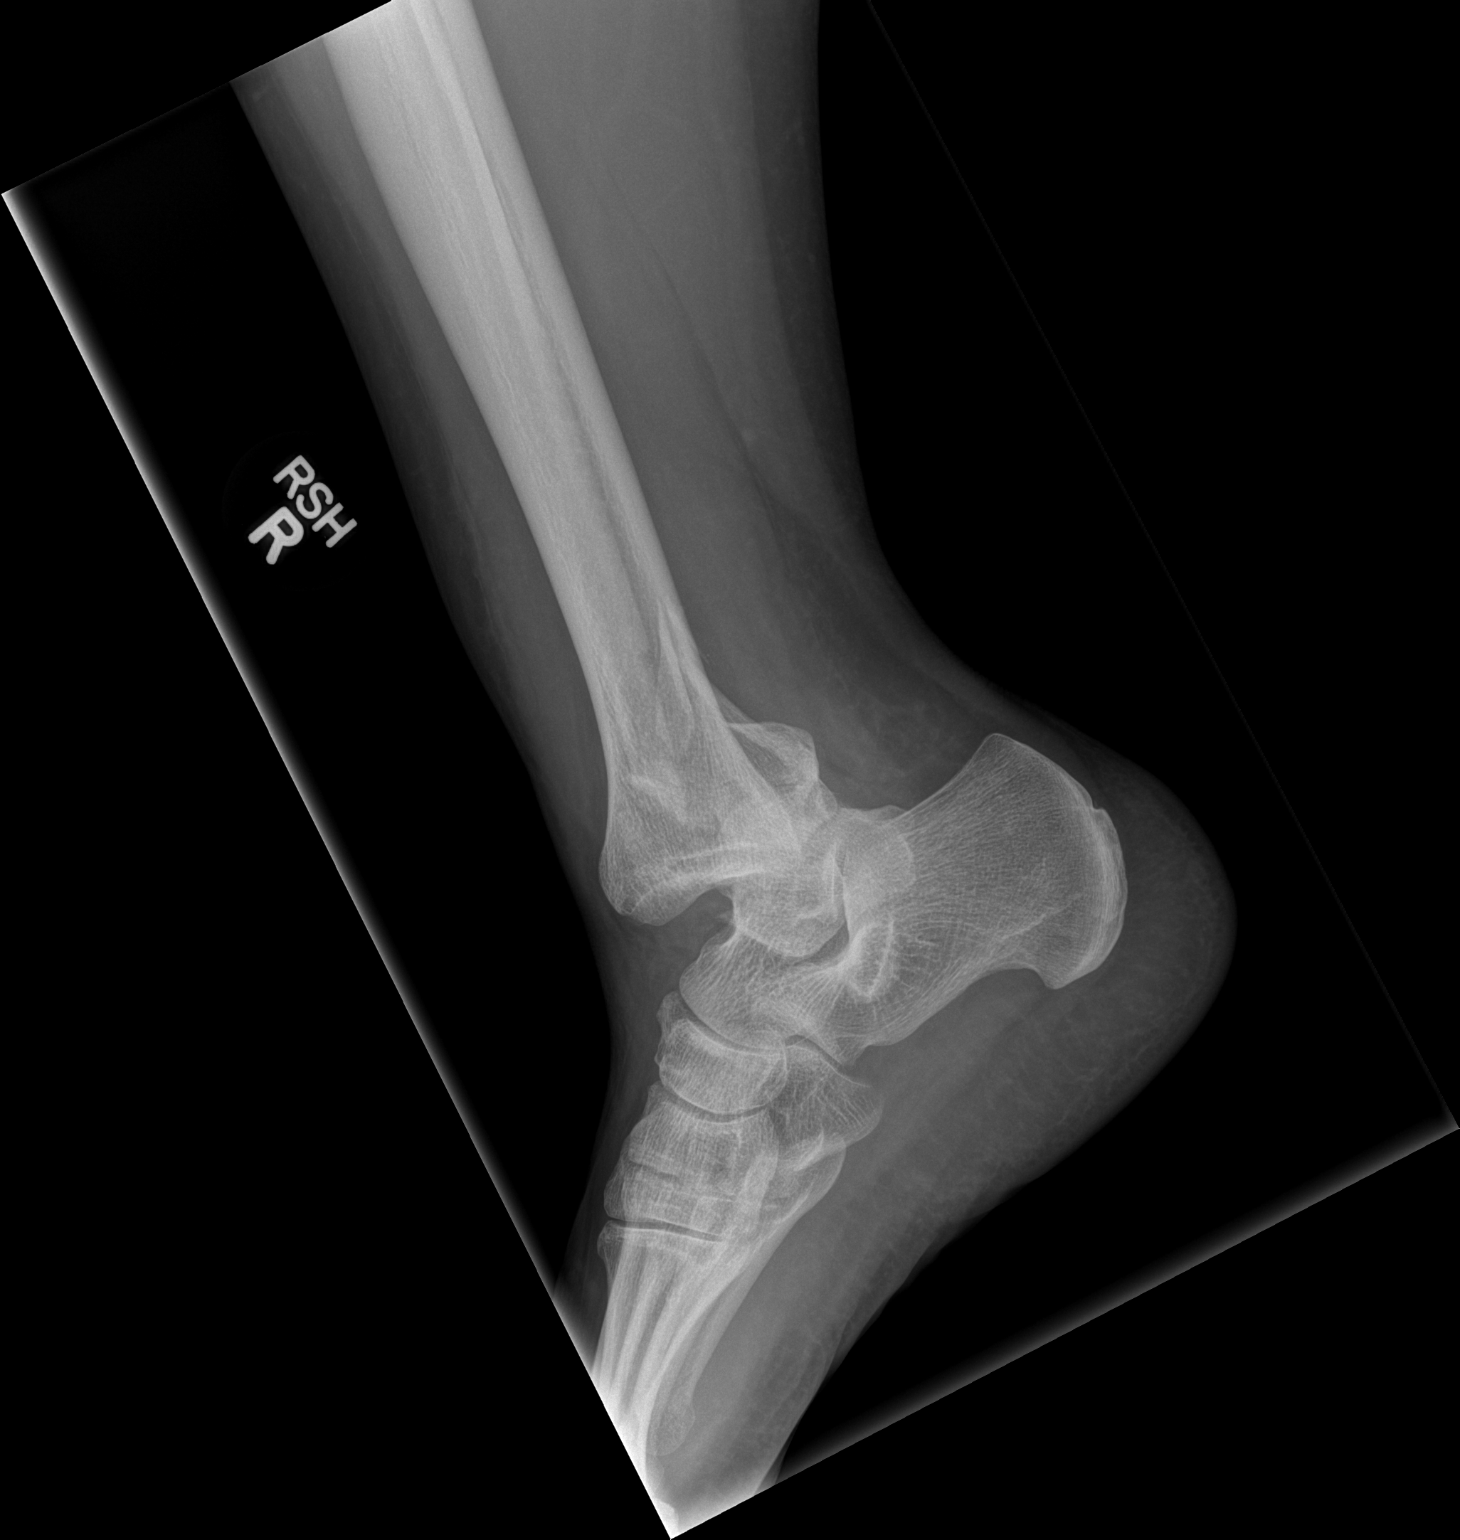

[x ankle ap right]
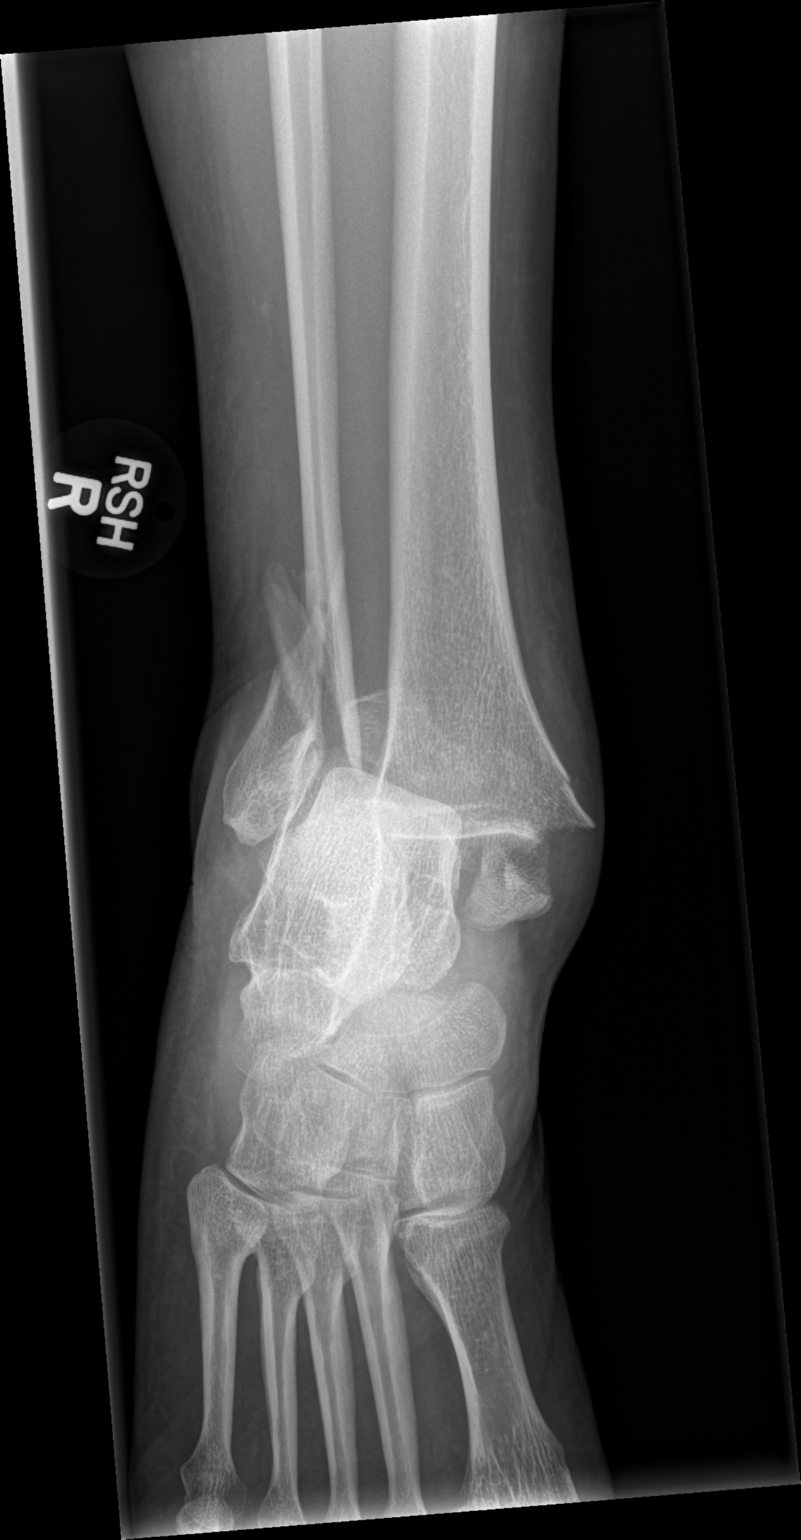

[x ankle obl right]
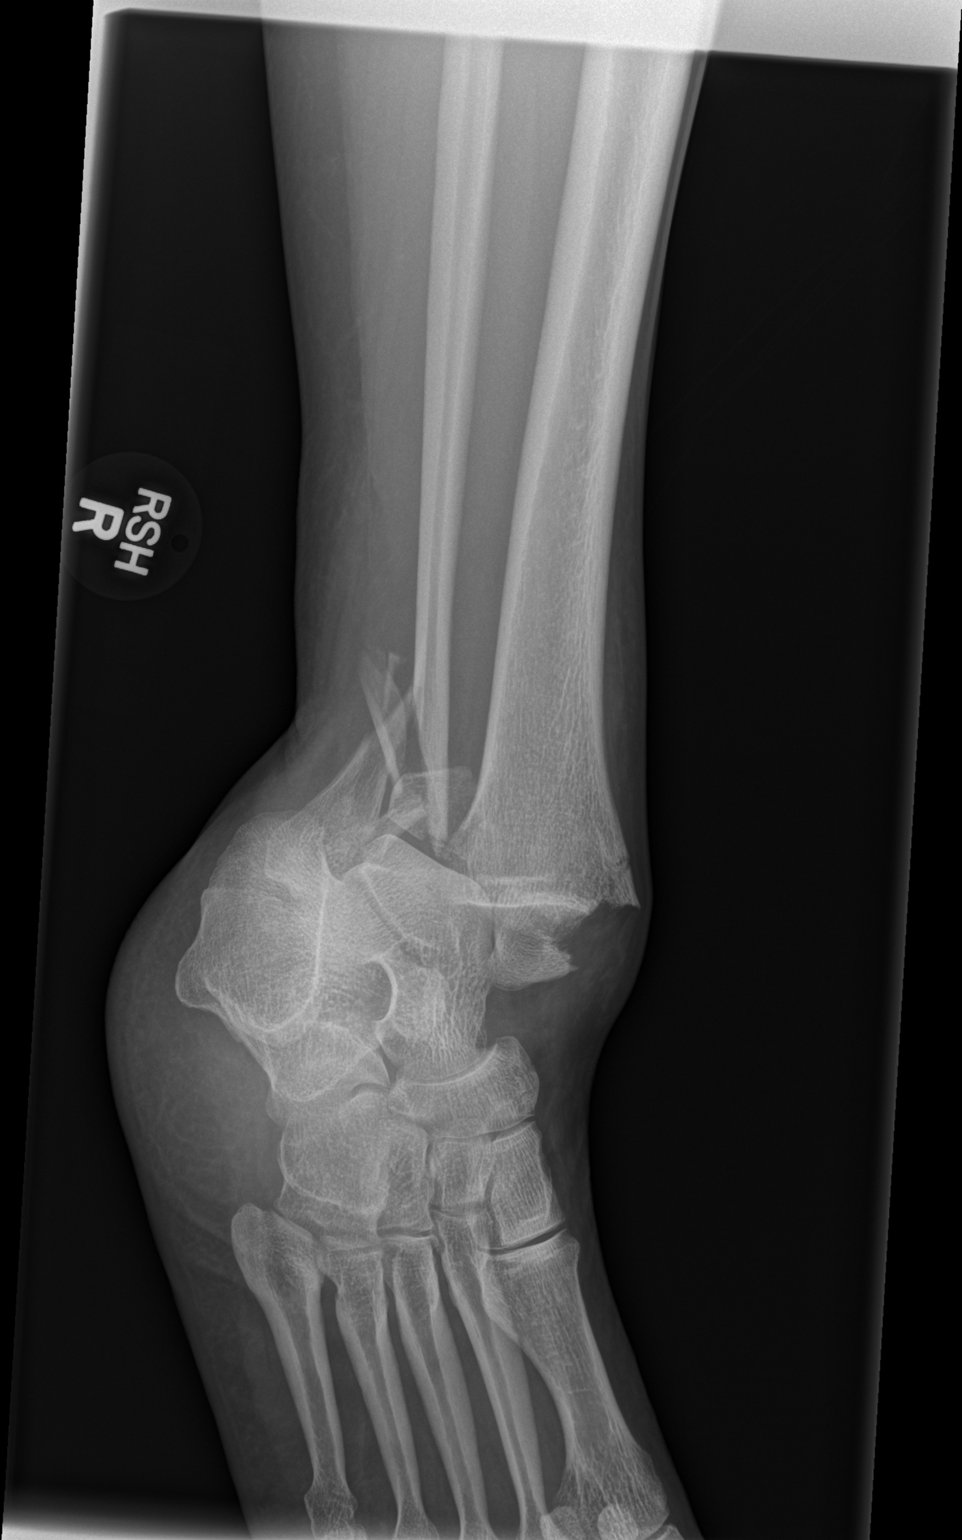

[3 of 3 positions shown; findings below may reference images not displayed]

FINDINGS: There is fracture dislocation of the right ankle. There is a
comminuted, laterally displaced medial malleolar fracture. There is
a comminuted fracture of the distal fibular diametaphysis.

There is disruption of the ankle mortise with opposed lateral
dislocation of the talar dome relative to the tibial plafond.

There is no other fracture or dislocation.
IMPRESSION: Right ankle fracture dislocation as described above.

## 2017-04-21 IMAGING — DX DG CHEST 2V
2 series · 2 of 2 positions shown · non-contrast
Comparison: May 15, 2015

CLINICAL DATA: Dizziness and hyperventilating.  Hypertension.

EXAM:
CHEST  2 VIEW

[w chest pa]
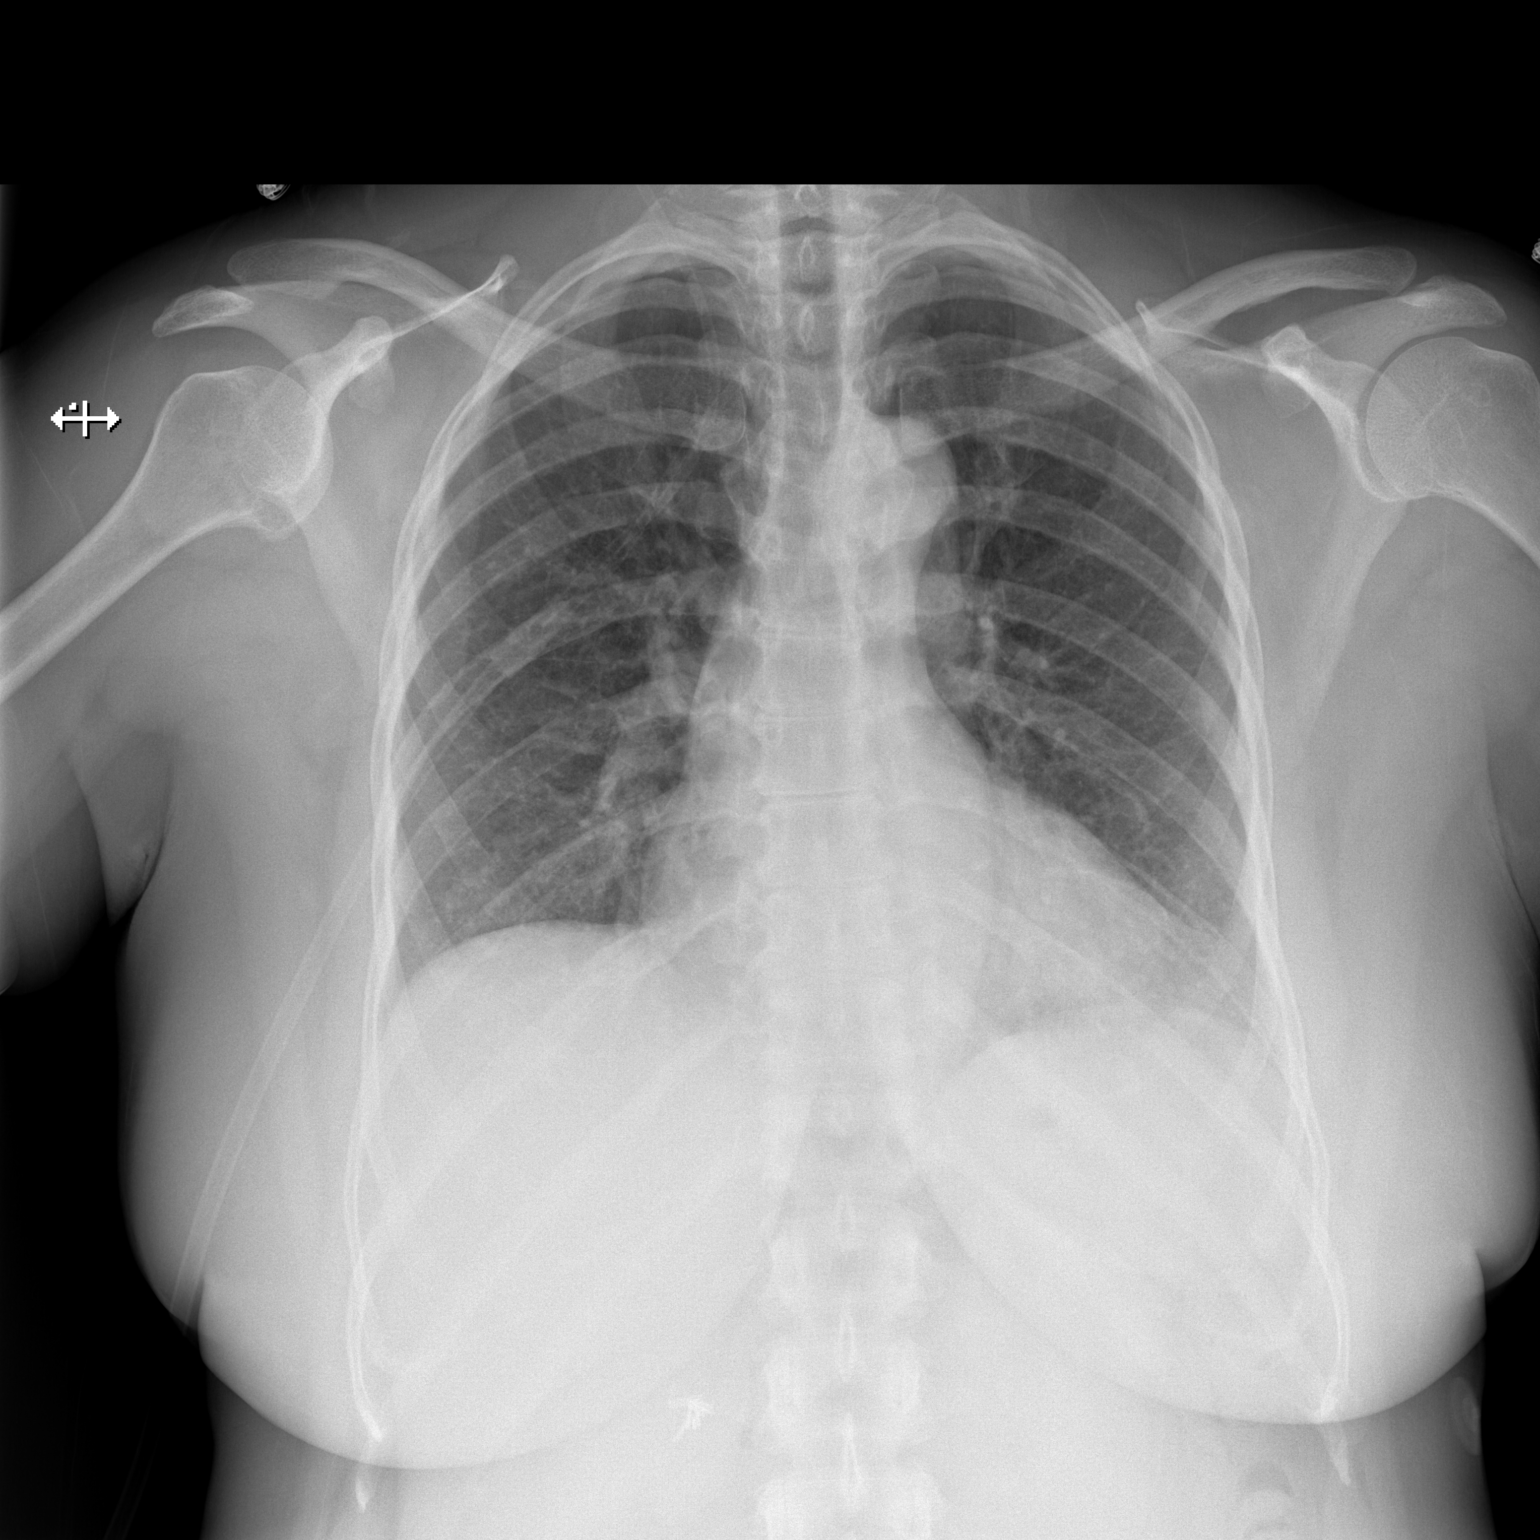

[w chest lat]
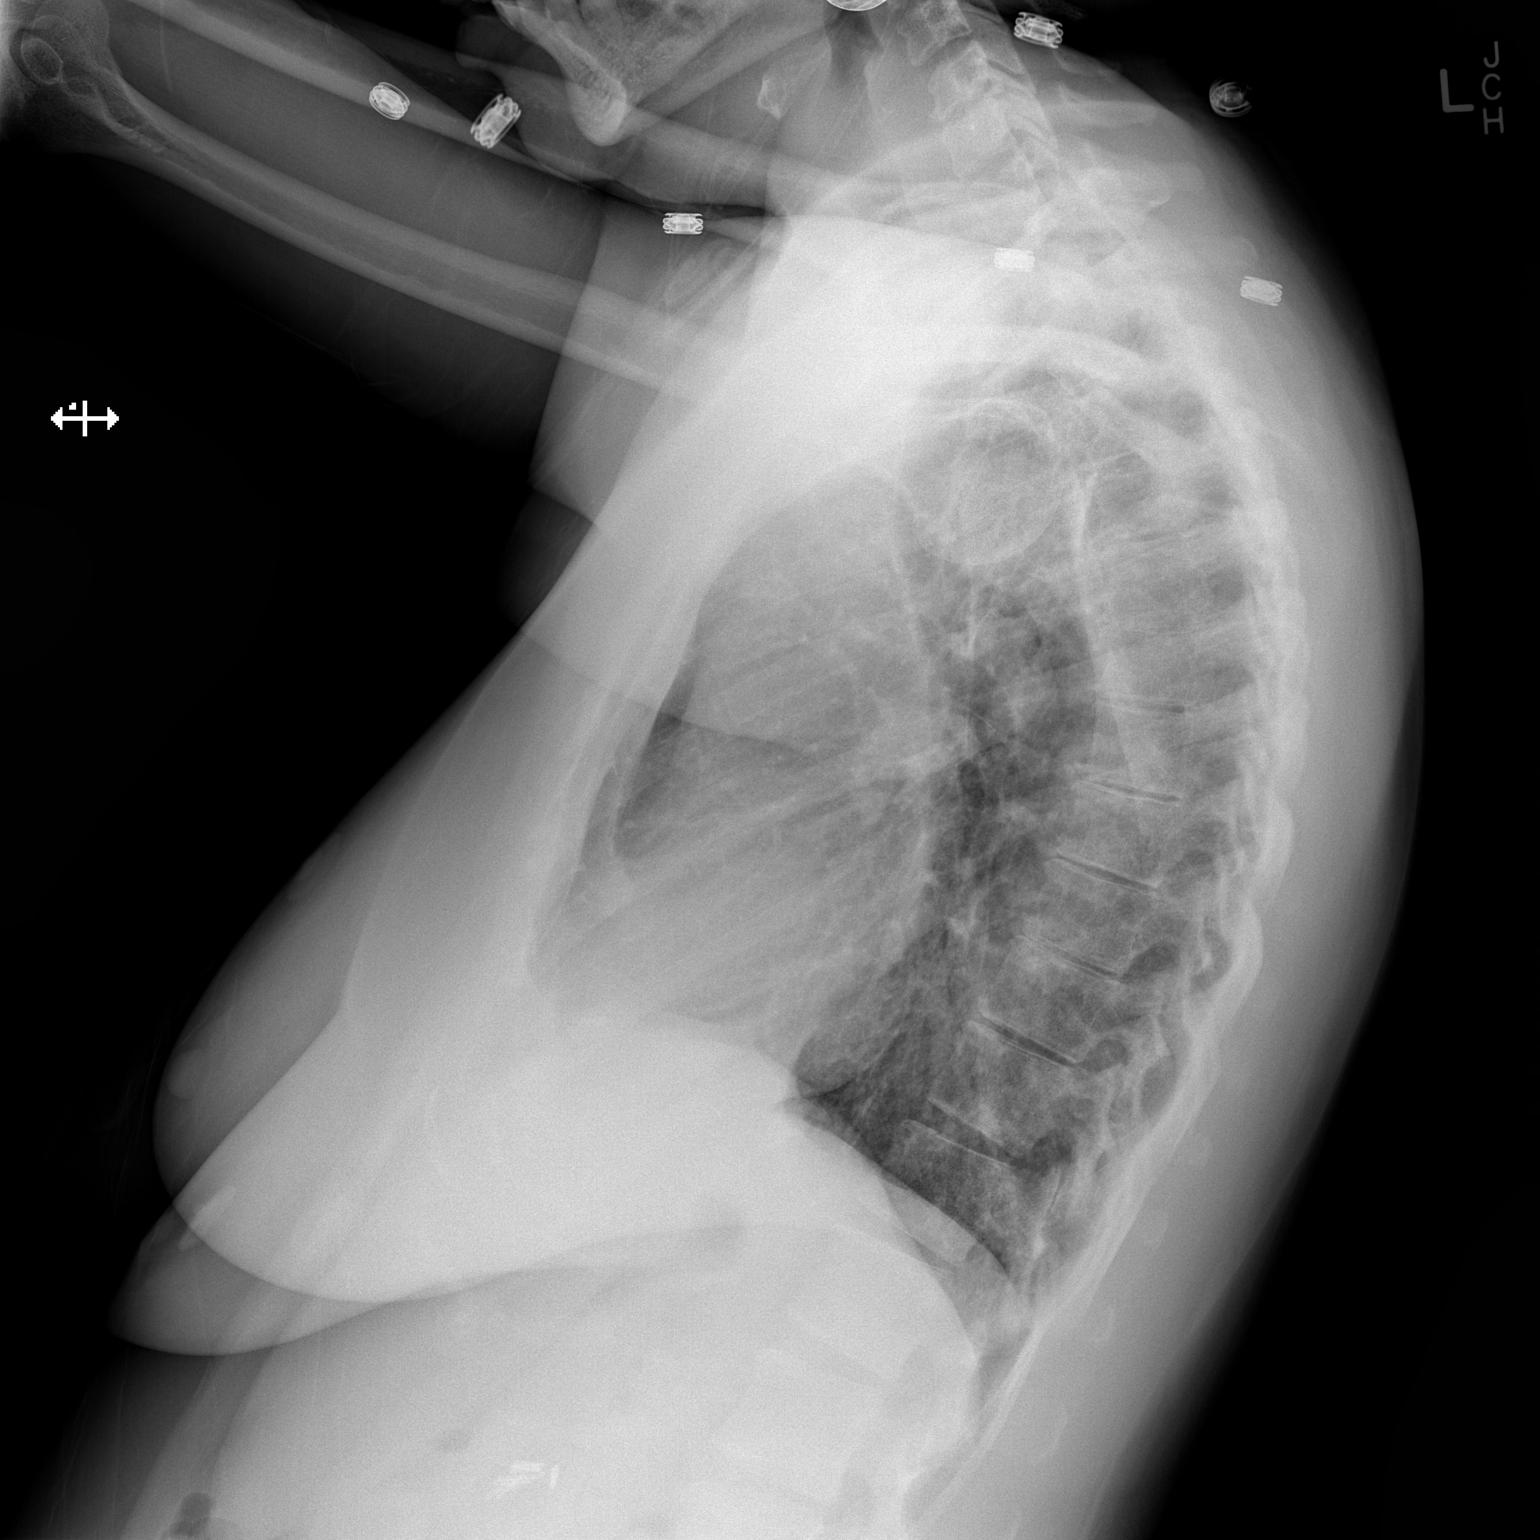

[2 of 2 positions shown; findings below may reference images not displayed]

FINDINGS: Lungs are clear. Heart size and pulmonary vascularity are normal. No
adenopathy. There is degenerative change in the mid thoracic spine
region. There are surgical clips in the right upper quadrant of the
abdomen.
IMPRESSION: No edema or consolidation.

## 2017-05-17 ENCOUNTER — Ambulatory Visit (INDEPENDENT_AMBULATORY_CARE_PROVIDER_SITE_OTHER): Admitting: Orthopedic Surgery

## 2017-05-17 ENCOUNTER — Ambulatory Visit (INDEPENDENT_AMBULATORY_CARE_PROVIDER_SITE_OTHER)

## 2017-05-17 ENCOUNTER — Encounter (INDEPENDENT_AMBULATORY_CARE_PROVIDER_SITE_OTHER): Payer: Self-pay | Admitting: Orthopedic Surgery

## 2017-05-17 DIAGNOSIS — M722 Plantar fascial fibromatosis: Secondary | ICD-10-CM | POA: Diagnosis not present

## 2017-05-17 DIAGNOSIS — M25571 Pain in right ankle and joints of right foot: Secondary | ICD-10-CM | POA: Insufficient documentation

## 2017-05-17 DIAGNOSIS — Z8781 Personal history of (healed) traumatic fracture: Secondary | ICD-10-CM

## 2017-05-17 DIAGNOSIS — M7711 Lateral epicondylitis, right elbow: Secondary | ICD-10-CM | POA: Diagnosis not present

## 2017-05-17 DIAGNOSIS — M25521 Pain in right elbow: Secondary | ICD-10-CM

## 2017-05-17 MED ORDER — LIDOCAINE HCL 1 % IJ SOLN
1.0000 mL | INTRAMUSCULAR | Status: AC | PRN
  Administered 2017-05-17: 1 mL

## 2017-05-17 MED ORDER — METHYLPREDNISOLONE ACETATE 40 MG/ML IJ SUSP
40.0000 mg | INTRAMUSCULAR | Status: AC | PRN
  Administered 2017-05-17: 40 mg via INTRA_ARTICULAR

## 2017-05-17 NOTE — Progress Notes (Signed)
Office Visit Note   Patient: Kayla Blake           Date of Birth: 1966/04/27           MRN: [id number] Visit Date: 05/17/2017              Requested by: Jonathon Jordan, MD [place], [place] [address] PCP: Jonathon Jordan, MD  Chief Complaint  Patient presents with  . Right Elbow - Pain  . Right Foot - Pain  . Right Ankle - Pain      HPI: Patient is a 51 year old woman who presents with a 2 month history of lateral chondrolysis of the right elbow. She has been using ice massage and a brace without relief. She states the pain now radiates down her forearm from the lateral condyle. Patient complains of plantar fascial pain on the right which is worse with start up in the morning. 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 Patient is approximately 2 years out from open reduction until fixation bimalleolar ankle fracture.  Assessment & Plan: Visit Diagnoses:  1. Pain in right ankle and joints of right foot   2. History of fracture of right ankle   3. Pain in right elbow   4. Lateral epicondylitis of right elbow   5. Plantar fasciitis of right foot     Plan: recommended Achilles stretching this was demonstrated to her to do a minute at a time 5 times a day. Discussed with her ankle is congruent and she does not need any intervention for the ankle. The lateral condyle was injected without complications. She will follow up if she is still symptomatic with her plantar fascia.  Follow-Up Instructions: Return if symptoms worsen or fail to improve.   Ortho Exam  Patient is alert, oriented, no adenopathy, well-dressed, normal affect, normal respiratory effort. Examination patient has a normal gait. She has good flexion and extension supination and pronation of the right elbow. She is point tender to palpation over the lateral condyle and resisted extension of the wrist reproduces her pain. Examination the  right foot she does have good range of motion of the right ankle with dorsiflexion only to neutral she has good pulses. She has no tenderness with lateral compression of the calcaneus. The tarsal tunnel is nontender to palpation. She is point tender to palpation of the origin of plantar fascia.  Imaging: Xr Ankle Complete Right  Result Date: 05/17/2017 2 view radiographs of the right ankle shows a congruent mortise stable internal fixation with no complicating features.  Xr Elbow 2 Views Right  Result Date: 05/17/2017 2 view radiographs of the right elbow shows a congruent joint with no bony spurs no effusion no subcondylar changes.  Xr Foot 2 Views Right  Result Date: 05/17/2017 2 view radiographs of the right foot shows no bony abnormality calcaneal spurs.  No images are attached to the encounter.  Labs: No results found for: HGBA1C, ESRSEDRATE, CRP, LABURIC, REPTSTATUS, GRAMSTAIN, CULT, LABORGA  Orders:  Orders Placed This Encounter  Procedures  . XR Elbow 2 Views Right  . XR Foot 2 Views Right  . XR Ankle Complete Right   No orders of the defined types were placed in this encounter.    Procedures: Medium Joint Inj Date/Time: 05/17/2017 1:24 PM Performed by: Newt Minion Authorized by: Newt Minion   Consent Given by:  Patient Site marked: the procedure  site was marked   Timeout: prior to procedure the correct patient, procedure, and site was verified   Indications:  Pain and diagnostic evaluation Location:  Elbow Site:  R lateral epicondyle Prep: patient was prepped and draped in usual sterile fashion   Needle Size:  22 G Needle Length:  1.5 inches Approach:  Anterolateral Ultrasound Guided: No   Fluoroscopic Guidance: No   Medications:  1 mL lidocaine 1 %; 40 mg methylPREDNISolone acetate 40 MG/ML Aspiration Attempted: No   Patient tolerance:  Patient tolerated the procedure well with no immediate complications    Clinical Data: No additional  findings.  ROS:  All other systems negative, except as noted in the HPI. Review of Systems  Objective: Vital Signs: LMP 09/03/2015   Specialty Comments:  No specialty comments available.  PMFS History: Patient Active Problem List   Diagnosis Date Noted  . Pain in right ankle and joints of right foot 05/17/2017  . History of fracture of right ankle 05/17/2017  . Pain in right elbow 05/17/2017  . Breast mass in female 07/01/2015  . Iron deficiency anemia, unspecified 03/31/2011   Past Medical History:  Diagnosis Date  . Alcohol abuse   . Allergic rhinitis   . Heart murmur   . Hypertension   . Uterine fibroids affecting pregnancy   . Vitamin D deficiency     Family History  Problem Relation Age of Onset  . Breast cancer Sister   . Colon cancer Neg Hx     Past Surgical History:  Procedure Laterality Date  . CHOLECYSTECTOMY    . ORIF ANKLE FRACTURE Right 05/15/2015   Procedure: OPEN REDUCTION INTERNAL FIXATION (ORIF) ANKLE FRACTURE;  Surgeon: Newt Minion, MD;  Location: [facility];  Service: Orthopedics;  Laterality: Right;  . RADIOACTIVE SEED GUIDED EXCISIONAL BREAST BIOPSY Right 07/16/2015   Procedure: RADIOACTIVE SEED GUIDED EXCISIONAL BREAST BIOPSY;  Surgeon: Rolm Bookbinder, MD;  Location: [facility];  Service: General;  Laterality: Right;  . TUBAL LIGATION     Social History   Occupational History  . Unemployed    Social History Main Topics  . Smoking status: Never Smoker  . Smokeless tobacco: Never Used  . Alcohol use 10.4 oz/week    14 Cans of beer, 4 Standard drinks or equivalent per week     Comment: on weekends   . Drug use: No  . Sexual activity: Not on file

## 2017-05-23 ENCOUNTER — Other Ambulatory Visit: Payer: Self-pay | Admitting: Family Medicine

## 2017-05-23 DIAGNOSIS — Z1231 Encounter for screening mammogram for malignant neoplasm of breast: Secondary | ICD-10-CM

## 2017-05-31 ENCOUNTER — Other Ambulatory Visit (HOSPITAL_COMMUNITY)
Admission: RE | Admit: 2017-05-31 | Discharge: 2017-05-31 | Disposition: A | Discharge status: Home / Self Care | Source: Ambulatory Visit | Attending: Family Medicine | Admitting: Family Medicine

## 2017-05-31 ENCOUNTER — Other Ambulatory Visit: Payer: Self-pay | Admitting: Family Medicine

## 2017-05-31 DIAGNOSIS — Z01411 Encounter for gynecological examination (general) (routine) with abnormal findings: Secondary | ICD-10-CM | POA: Insufficient documentation

## 2017-06-02 LAB — CYTOLOGY - PAP: DIAGNOSIS: NEGATIVE

## 2017-06-27 ENCOUNTER — Ambulatory Visit

## 2017-07-27 ENCOUNTER — Ambulatory Visit

## 2017-08-08 ENCOUNTER — Ambulatory Visit
Admission: RE | Admit: 2017-08-08 | Discharge: 2017-08-08 | Disposition: A | Discharge status: Home / Self Care | Source: Ambulatory Visit | Attending: Family Medicine | Admitting: Family Medicine

## 2017-08-08 DIAGNOSIS — Z1231 Encounter for screening mammogram for malignant neoplasm of breast: Secondary | ICD-10-CM

## 2017-08-19 ENCOUNTER — Ambulatory Visit

## 2018-07-10 ENCOUNTER — Ambulatory Visit
Admission: RE | Admit: 2018-07-10 | Discharge: 2018-07-10 | Disposition: A | Discharge status: Home / Self Care | Source: Ambulatory Visit | Attending: Family Medicine | Admitting: Family Medicine

## 2018-07-10 ENCOUNTER — Other Ambulatory Visit: Payer: Self-pay | Admitting: Family Medicine

## 2018-07-10 DIAGNOSIS — M549 Dorsalgia, unspecified: Secondary | ICD-10-CM

## 2018-07-17 ENCOUNTER — Other Ambulatory Visit: Payer: Self-pay | Admitting: Family Medicine

## 2018-07-17 DIAGNOSIS — Z1231 Encounter for screening mammogram for malignant neoplasm of breast: Secondary | ICD-10-CM

## 2018-08-23 ENCOUNTER — Ambulatory Visit
Admission: RE | Admit: 2018-08-23 | Discharge: 2018-08-23 | Disposition: A | Discharge status: Home / Self Care | Source: Ambulatory Visit | Attending: Family Medicine | Admitting: Family Medicine

## 2018-08-23 DIAGNOSIS — Z1231 Encounter for screening mammogram for malignant neoplasm of breast: Secondary | ICD-10-CM

## 2019-04-17 ENCOUNTER — Other Ambulatory Visit: Payer: Self-pay | Admitting: Family Medicine

## 2019-04-17 DIAGNOSIS — M542 Cervicalgia: Secondary | ICD-10-CM

## 2019-05-05 ENCOUNTER — Other Ambulatory Visit: Payer: Self-pay

## 2019-05-05 ENCOUNTER — Ambulatory Visit
Admission: RE | Admit: 2019-05-05 | Discharge: 2019-05-05 | Disposition: A | Discharge status: Home / Self Care | Source: Ambulatory Visit | Attending: Family Medicine | Admitting: Family Medicine

## 2019-05-05 DIAGNOSIS — M542 Cervicalgia: Secondary | ICD-10-CM

## 2019-06-04 ENCOUNTER — Other Ambulatory Visit: Payer: Self-pay | Admitting: Sports Medicine

## 2019-06-04 DIAGNOSIS — M542 Cervicalgia: Secondary | ICD-10-CM

## 2019-06-08 ENCOUNTER — Other Ambulatory Visit: Payer: Self-pay

## 2019-06-08 ENCOUNTER — Ambulatory Visit
Admission: RE | Admit: 2019-06-08 | Discharge: 2019-06-08 | Disposition: A | Discharge status: Home / Self Care | Source: Ambulatory Visit | Attending: Sports Medicine | Admitting: Sports Medicine

## 2019-06-08 DIAGNOSIS — M542 Cervicalgia: Secondary | ICD-10-CM

## 2019-06-08 MED ORDER — IOPAMIDOL (ISOVUE-M 300) INJECTION 61%
1.0000 mL | Freq: Once | INTRAMUSCULAR | Status: AC | PRN
  Administered 2019-06-08: 10:00:00 1 mL via EPIDURAL

## 2019-06-08 MED ORDER — TRIAMCINOLONE ACETONIDE 40 MG/ML IJ SUSP (RADIOLOGY)
60.0000 mg | Freq: Once | INTRAMUSCULAR | Status: AC
  Administered 2019-06-08: 60 mg via EPIDURAL

## 2019-06-08 NOTE — Discharge Instructions (Signed)

## 2019-07-16 ENCOUNTER — Other Ambulatory Visit: Payer: Self-pay | Admitting: Family Medicine

## 2019-07-16 DIAGNOSIS — Z1231 Encounter for screening mammogram for malignant neoplasm of breast: Secondary | ICD-10-CM

## 2019-09-04 ENCOUNTER — Other Ambulatory Visit: Payer: Self-pay

## 2019-09-04 ENCOUNTER — Ambulatory Visit
Admission: RE | Admit: 2019-09-04 | Discharge: 2019-09-04 | Disposition: A | Discharge status: Home / Self Care | Source: Ambulatory Visit | Attending: Family Medicine | Admitting: Family Medicine

## 2019-09-04 DIAGNOSIS — Z1231 Encounter for screening mammogram for malignant neoplasm of breast: Secondary | ICD-10-CM

## 2019-09-05 ENCOUNTER — Other Ambulatory Visit: Payer: Self-pay | Admitting: Family Medicine

## 2019-09-05 DIAGNOSIS — R928 Other abnormal and inconclusive findings on diagnostic imaging of breast: Secondary | ICD-10-CM

## 2019-09-14 ENCOUNTER — Ambulatory Visit
Admission: RE | Admit: 2019-09-14 | Discharge: 2019-09-14 | Disposition: A | Discharge status: Home / Self Care | Source: Ambulatory Visit | Attending: Family Medicine | Admitting: Family Medicine

## 2019-09-14 ENCOUNTER — Other Ambulatory Visit: Payer: Self-pay

## 2019-09-14 ENCOUNTER — Other Ambulatory Visit: Payer: Self-pay | Admitting: Family Medicine

## 2019-09-14 DIAGNOSIS — R928 Other abnormal and inconclusive findings on diagnostic imaging of breast: Secondary | ICD-10-CM

## 2019-09-18 ENCOUNTER — Other Ambulatory Visit: Payer: Self-pay | Admitting: Sports Medicine

## 2019-09-24 ENCOUNTER — Other Ambulatory Visit: Payer: Self-pay | Admitting: Sports Medicine

## 2019-09-24 DIAGNOSIS — M546 Pain in thoracic spine: Secondary | ICD-10-CM

## 2019-10-09 ENCOUNTER — Ambulatory Visit
Admission: RE | Admit: 2019-10-09 | Discharge: 2019-10-09 | Disposition: A | Discharge status: Home / Self Care | Source: Ambulatory Visit | Attending: Sports Medicine | Admitting: Sports Medicine

## 2019-10-09 ENCOUNTER — Other Ambulatory Visit: Payer: Self-pay

## 2019-10-09 DIAGNOSIS — M546 Pain in thoracic spine: Secondary | ICD-10-CM

## 2020-02-08 ENCOUNTER — Telehealth: Admitting: Emergency Medicine

## 2020-02-08 DIAGNOSIS — R059 Cough, unspecified: Secondary | ICD-10-CM

## 2020-02-08 DIAGNOSIS — R05 Cough: Secondary | ICD-10-CM

## 2020-02-08 MED ORDER — BENZONATATE 100 MG PO CAPS: 100.0000 mg | ORAL_CAPSULE | Freq: Two times a day (BID) | ORAL | 0 refills | Status: DC | PRN

## 2020-02-08 MED ORDER — ALBUTEROL SULFATE HFA 108 (90 BASE) MCG/ACT IN AERS: 2.0000 | INHALATION_SPRAY | RESPIRATORY_TRACT | 0 refills | Status: DC | PRN

## 2020-02-08 NOTE — Progress Notes (Signed)
We are sorry that you are not feeling well.  Here is how we plan to help!  Based on your presentation I believe you most likely have A cough due to a virus.  This is called viral bronchitis and is best treated by rest, plenty of fluids and control of the cough.  You may use Ibuprofen or Tylenol as directed to help your symptoms.     In addition you may use A prescription cough medication called Tessalon Perles 100mg . You may take 1-2 capsules every 8 hours as needed for your cough.  I've also sent an inhaler.  While some BP medications can cause cough, they are most commonly from the ACE inhibitor class, such as Lisinopril.  Try treating your symptoms with the medications I've prescribed.  If you don't notice improvement in the next 7 days, or if your worsen or run a fever, you should be seen in person.  From your responses in the eVisit questionnaire you describe inflammation in the upper respiratory tract which is causing a significant cough.  This is commonly called Bronchitis and has four common causes:    Allergies  Viral Infections  Acid Reflux  Bacterial Infection Allergies, viruses and acid reflux are treated by controlling symptoms or eliminating the cause. An example might be a cough caused by taking certain blood pressure medications. You stop the cough by changing the medication. Another example might be a cough caused by acid reflux. Controlling the reflux helps control the cough.  USE OF BRONCHODILATOR ("RESCUE") INHALERS: There is a risk from using your bronchodilator too frequently.  The risk is that over-reliance on a medication which only relaxes the muscles surrounding the breathing tubes can reduce the effectiveness of medications prescribed to reduce swelling and congestion of the tubes themselves.  Although you feel brief relief from the bronchodilator inhaler, your asthma may actually be worsening with the tubes becoming more swollen and filled with mucus.  This can  delay other crucial treatments, such as oral steroid medications. If you need to use a bronchodilator inhaler daily, several times per day, you should discuss this with your provider.  There are probably better treatments that could be used to keep your asthma under control.     HOME CARE . Only take medications as instructed by your medical team. . Complete the entire course of an antibiotic. . Drink plenty of fluids and get plenty of rest. . Avoid close contacts especially the very young and the elderly . Cover your mouth if you cough or cough into your sleeve. . Always remember to wash your hands . A steam or ultrasonic humidifier can help congestion.   GET HELP RIGHT AWAY IF: . You develop worsening fever. . You become short of breath . You cough up blood. . Your symptoms persist after you have completed your treatment plan MAKE SURE YOU   Understand these instructions.  Will watch your condition.  Will get help right away if you are not doing well or get worse.  Your e-visit answers were reviewed by a board certified advanced clinical practitioner to complete your personal care plan.  Depending on the condition, your plan could have included both over the counter or prescription medications. If there is a problem please reply  once you have received a response from your provider. Your safety is important to [place].  If you have drug allergies check your prescription carefully.    You can use MyChart to ask questions about today's visit, request a  non-urgent call back, or ask for a work or school excuse for 24 hours related to this e-Visit. If it has been greater than 24 hours you will need to follow up with your provider, or enter a new e-Visit to address those concerns. You will get an e-mail in the next two days asking about your experience.  I hope that your e-visit has been valuable and will speed your recovery. Thank you for using e-visits.  Approximately 5 minutes was used in  reviewing the patient's chart, questionnaire, prescribing medications, and documentation.

## 2020-03-17 ENCOUNTER — Other Ambulatory Visit: Payer: Self-pay | Admitting: Family Medicine

## 2020-03-17 ENCOUNTER — Ambulatory Visit
Admission: RE | Admit: 2020-03-17 | Discharge: 2020-03-17 | Disposition: A | Discharge status: Home / Self Care | Source: Ambulatory Visit | Attending: Family Medicine | Admitting: Family Medicine

## 2020-03-17 ENCOUNTER — Other Ambulatory Visit: Payer: Self-pay

## 2020-03-17 DIAGNOSIS — R928 Other abnormal and inconclusive findings on diagnostic imaging of breast: Secondary | ICD-10-CM

## 2020-03-17 DIAGNOSIS — R2231 Localized swelling, mass and lump, right upper limb: Secondary | ICD-10-CM

## 2020-05-19 ENCOUNTER — Emergency Department
Admission: EM | Admit: 2020-05-19 | Discharge: 2020-05-19 | Disposition: A | Discharge status: Home / Self Care | Attending: Emergency Medicine | Admitting: Emergency Medicine

## 2020-05-19 ENCOUNTER — Other Ambulatory Visit: Payer: Self-pay

## 2020-05-19 ENCOUNTER — Emergency Department

## 2020-05-19 ENCOUNTER — Encounter: Payer: Self-pay | Admitting: Emergency Medicine

## 2020-05-19 DIAGNOSIS — R0602 Shortness of breath: Secondary | ICD-10-CM | POA: Insufficient documentation

## 2020-05-19 DIAGNOSIS — Z79899 Other long term (current) drug therapy: Secondary | ICD-10-CM | POA: Insufficient documentation

## 2020-05-19 DIAGNOSIS — R079 Chest pain, unspecified: Secondary | ICD-10-CM

## 2020-05-19 DIAGNOSIS — E876 Hypokalemia: Secondary | ICD-10-CM | POA: Diagnosis not present

## 2020-05-19 DIAGNOSIS — R001 Bradycardia, unspecified: Secondary | ICD-10-CM

## 2020-05-19 DIAGNOSIS — R0789 Other chest pain: Secondary | ICD-10-CM | POA: Insufficient documentation

## 2020-05-19 DIAGNOSIS — R200 Anesthesia of skin: Secondary | ICD-10-CM | POA: Diagnosis not present

## 2020-05-19 DIAGNOSIS — I10 Essential (primary) hypertension: Secondary | ICD-10-CM | POA: Diagnosis not present

## 2020-05-19 LAB — MAGNESIUM: Magnesium: 2.3 mg/dL (ref 1.7–2.4)

## 2020-05-19 LAB — BASIC METABOLIC PANEL
Anion gap: 11 (ref 5–15)
BUN: 12 mg/dL (ref 6–20)
CO2: 27 mmol/L (ref 22–32)
Calcium: 9.3 mg/dL (ref 8.9–10.3)
Chloride: 102 mmol/L (ref 98–111)
Creatinine, Ser: 0.91 mg/dL (ref 0.44–1.00)
GFR, Estimated: >60 mL/min (ref >60)
Glucose, Bld: 103 mg/dL — ABNORMAL HIGH (ref 70–99)
Potassium: 2.6 mmol/L — CL (ref 3.5–5.1)
Sodium: 140 mmol/L (ref 135–145)

## 2020-05-19 LAB — CBC
HCT: 39.6 % (ref 36.0–46.0)
Hemoglobin: 13.8 g/dL (ref 12.0–15.0)
MCH: 31.8 pg (ref 26.0–34.0)
MCHC: 34.8 g/dL (ref 30.0–36.0)
MCV: 91.2 fL (ref 80.0–100.0)
Platelets: 213 10*3/uL (ref 150–400)
RBC: 4.34 MIL/uL (ref 3.87–5.11)
RDW: 13.2 % (ref 11.5–15.5)
WBC: 5.4 10*3/uL (ref 4.0–10.5)
nRBC: 0 % (ref 0.0–0.2)

## 2020-05-19 LAB — HEPATIC FUNCTION PANEL
ALT: 27 U/L (ref 0–44)
AST: 28 U/L (ref 15–41)
Albumin: 4 g/dL (ref 3.5–5.0)
Alkaline Phosphatase: 52 U/L (ref 38–126)
Bilirubin, Direct: 0.1 mg/dL (ref 0.0–0.2)
Indirect Bilirubin: 0.9 mg/dL (ref 0.3–0.9)
Total Bilirubin: 1 mg/dL (ref 0.3–1.2)
Total Protein: 6.5 g/dL (ref 6.5–8.1)

## 2020-05-19 LAB — TROPONIN I (HIGH SENSITIVITY)
Troponin I (High Sensitivity): 4 ng/L (ref <18)
Troponin I (High Sensitivity): 5 ng/L (ref <18)

## 2020-05-19 MED ORDER — POTASSIUM CHLORIDE CRYS ER 20 MEQ PO TBCR
80.0000 meq | EXTENDED_RELEASE_TABLET | Freq: Once | ORAL | Status: AC
  Administered 2020-05-19: 80 meq via ORAL
  Filled 2020-05-19: qty 4

## 2020-05-19 NOTE — ED Provider Notes (Signed)
[facility] Emergency Department Provider Note  ____________________________________________   First MD Initiated Contact with Patient 05/19/20 1347     (approximate)  I have reviewed the triage vital signs and the nursing notes.   HISTORY  Chief Complaint Chest Pain   HPI Kayla Blake is a 54 y.o. female with a past medical history of HTN, EtOH abuse approximately 4 glasses of wine per night, and allergic rhinitis who presents for assessment of substernal chest pressure associate with some numbness in her fingertips bilaterally.  Patient states this has been happening on and off for the last several weeks and that she feels her chest pressure today with more intermittently than usual.  She states she had an episode last night that felt similar but this went away on its own.  These episodes typically occur at rest and are not exertional or positional.  He states he also has some tingling in bilateral extremities as well as some shortness of breath when she is feeling chest pressure.  She states she currently feels asymptomatic and denies any current chest pain on interview.  Denies any cough, fevers, chills, headache, earache, sore throat, nausea, vomiting, diarrhea, dysuria, Donnell pain, back pain, rash, extremity pain, acute complaints.  Denies tobacco abuse or illegal drug use.  No known CAD, or history of PE/DVT.         Past Medical History:  Diagnosis Date  . Alcohol abuse   . Allergic rhinitis   . Heart murmur   . Hypertension   . Uterine fibroids affecting pregnancy   . Vitamin D deficiency     Patient Active Problem List   Diagnosis Date Noted  . Pain in right ankle and joints of right foot 05/17/2017  . History of fracture of right ankle 05/17/2017  . Pain in right elbow 05/17/2017  . Breast mass in female 07/01/2015  . Iron deficiency anemia, unspecified 03/31/2011    Past Surgical History:  Procedure Laterality Date  . BREAST  EXCISIONAL BIOPSY    . CHOLECYSTECTOMY    . ORIF ANKLE FRACTURE Right 05/15/2015   Procedure: OPEN REDUCTION INTERNAL FIXATION (ORIF) ANKLE FRACTURE;  Surgeon: Newt Minion, MD;  Location: [facility];  Service: Orthopedics;  Laterality: Right;  . RADIOACTIVE SEED GUIDED EXCISIONAL BREAST BIOPSY Right 07/16/2015   Procedure: RADIOACTIVE SEED GUIDED EXCISIONAL BREAST BIOPSY;  Surgeon: Rolm Bookbinder, MD;  Location: [facility];  Service: General;  Laterality: Right;  . TUBAL LIGATION      Prior to Admission medications   Medication Sig Start Date End Date Taking? Authorizing Provider  albuterol (VENTOLIN HFA) 108 (90 Base) MCG/ACT inhaler Inhale 2 puffs into the lungs every 4 (four) hours as needed for wheezing or shortness of breath. 02/08/20   Montine Circle, PA-C  amLODipine (NORVASC) 5 MG tablet Take 5 mg by mouth daily. 05/06/15   [provider]  benzonatate (TESSALON) 100 MG capsule Take 1 capsule (100 mg total) by mouth 2 (two) times daily as needed for cough. 02/08/20   Montine Circle, PA-C  hydrochlorothiazide (HYDRODIURIL) 25 MG tablet Take 25 mg by mouth daily. 05/06/15   [provider]  Multiple Vitamins-Minerals (MULTIVITAMINS THER. W/MINERALS) TABS tablet Take 1 tablet by mouth daily.    [provider]  PARoxetine (PAXIL) 20 MG tablet Take 20 mg by mouth daily. 04/27/15   [provider]    Allergies Patient has no known allergies.  Family History  Problem Relation Age of Onset  .  Breast cancer Sister   . Colon cancer Neg Hx     Social History Social History   Tobacco Use  . Smoking status: Never Smoker  . Smokeless tobacco: Never Used  Substance Use Topics  . Alcohol use: Yes    Alcohol/week: 18.0 standard drinks    Types: 14 Cans of beer, 4 Standard drinks or equivalent per week    Comment: on weekends   . Drug use: No    Review of Systems  Review of Systems  Constitutional: Negative for chills and fever.   HENT: Negative for sore throat.   Eyes: Negative for pain.  Respiratory: Positive for shortness of breath. Negative for cough and stridor.   Cardiovascular: Positive for chest pain.  Gastrointestinal: Negative for vomiting.  Genitourinary: Negative for dysuria.  Musculoskeletal: Negative for myalgias.  Skin: Negative for rash.  Neurological: Negative for seizures, loss of consciousness and headaches.  Psychiatric/Behavioral: Negative for suicidal ideas.  All other systems reviewed and are negative.     ____________________________________________   PHYSICAL EXAM:  VITAL SIGNS: ED Triage Vitals [05/19/20 1217]  Enc Vitals Group     BP 137/83     Pulse Rate (!) 59     Resp 18     Temp 98.4 F (36.9 C)     Temp Source Oral     SpO2 98 %     Weight 188 lb (85.3 kg)     Height 5\' 6"  (1.676 m)     Head Circumference      Peak Flow      Pain Score 0     Pain Loc      Pain Edu?      Excl. in [place]?    Vitals:   05/19/20 1411 05/19/20 1430  BP: 139/86 131/83  Pulse: (!) 57 (!) 57  Resp: 19 18  Temp:    SpO2: 98% 100%   Physical Exam Vitals and nursing note reviewed.  Constitutional:      General: She is not in acute distress.    Appearance: She is well-developed.  HENT:     Head: Normocephalic and atraumatic.     Right Ear: External ear normal.     Left Ear: External ear normal.     Nose: Nose normal.     Mouth/Throat:     Mouth: Mucous membranes are moist.  Eyes:     Conjunctiva/sclera: Conjunctivae normal.  Cardiovascular:     Rate and Rhythm: Regular rhythm. Bradycardia present.     Heart sounds: No murmur heard.   Pulmonary:     Effort: Pulmonary effort is normal. No respiratory distress.     Breath sounds: Normal breath sounds.  Abdominal:     Palpations: Abdomen is soft.     Tenderness: There is no abdominal tenderness.  Musculoskeletal:     Cervical back: Neck supple.     Right lower leg: No edema.     Left lower leg: No edema.  Skin:     General: Skin is warm and dry.     Capillary Refill: Capillary refill takes less than 2 seconds.  Neurological:     Mental Status: She is alert and oriented to person, place, and time.  Psychiatric:        Mood and Affect: Mood normal.      ____________________________________________   LABS (all labs ordered are listed, but only abnormal results are displayed)  Labs Reviewed  BASIC METABOLIC PANEL - Abnormal; Notable for the following components:  Result Value   Potassium 2.6 (*)    Glucose, Bld 103 (*)    All other components within normal limits  CBC  MAGNESIUM  HEPATIC FUNCTION PANEL  TROPONIN I (HIGH SENSITIVITY)  TROPONIN I (HIGH SENSITIVITY)   ____________________________________________  EKG  Sinus bradycardia with a ventricular rate of 57, normal axis, unremarkable intervals, nonspecific ST change in lead III and lead II without other evidence of acute ischemia. ____________________________________________  RADIOLOGY  ED MD interpretation: No evidence of pneumonia, thorax, edema, effusion, or other acute thoracic process.  Official radiology report(s): DG Chest 2 View  Result Date: 05/19/2020 CLINICAL DATA:  Chest pressure and shortness of breath. EXAM: CHEST - 2 VIEW COMPARISON:  Chest x-ray dated October 13, 2015. FINDINGS: The heart size and mediastinal contours are within normal limits. Both lungs are clear. The visualized skeletal structures are unremarkable. IMPRESSION: No active cardiopulmonary disease. Electronically Signed   By: Titus Dubin M.D.   On: 05/19/2020 12:50    ____________________________________________   PROCEDURES  Procedure(s) performed (including Critical Care):  .1-3 Lead EKG Interpretation Performed by: Lucrezia Starch, MD Authorized by: Lucrezia Starch, MD     Interpretation: abnormal     ECG rate assessment: bradycardic     Rhythm: sinus rhythm     Ectopy: none     Conduction: normal        ____________________________________________   INITIAL IMPRESSION / ASSESSMENT AND PLAN / ED COURSE        Patient presents with [place] to history exam for assessment of substernal chest pressure associated with some shortness of breath no significant episode occurred earlier today similar to prior episodes.  Patient is slightly bradycardic with heart rate of 59 with otherwise stable vital signs on arrival.  Differential includes but is not limited to PE, ACS, pneumonia, anemia, pneumothorax, metabolic derangements, arrhythmia, and GERD.  History is not clearly consistent with GERD.  Low suspicion for PE as patient denies any current chest pain or shortness of breath and has no evidence of tachycardia, tachypnea, hypoxia.  Chest x-ray shows no evidence of pneumonia pneumothorax or other intrathoracic process.  Low suspicion for ACS given patient denies any chest pain at present and has a reassuring EKG with 2 nonelevated troponins obtained over 2 hours.  CBC is unremarkable and has no evidence of anemia.  BMP remarkable for potassium of 2.6 otherwise no significant electrolyte or metabolic derangements.  Patient's EKG is slightly bradycardic at low suspicion for symptomatic bradycardia and is unclear if this is related to patient's potassium.  Potassium completed.  No other evidence of significant arrhythmia on ECG.  Given patient stating she is currently asymptomatic with otherwise reassuring vital signs and work-up, I believe she is safe for discharge with plan for continued outpatient work-up and follow-up. Patient discharge stable condition.  Strict return precautions advised discussed.  ____________________________________________   FINAL CLINICAL IMPRESSION(S) / ED DIAGNOSES  Final diagnoses:  Chest pain, unspecified type  Hypokalemia  Bradycardia    Medications  potassium chloride SA (KLOR-CON) CR tablet 80 mEq (80 mEq Oral Given 05/19/20 1405)     ED Discharge Orders     None       Note:  This document was prepared using Dragon voice recognition software and may include unintentional dictation errors.   Lucrezia Starch, MD 05/19/20 [phone]

## 2020-05-19 NOTE — ED Triage Notes (Signed)
PT to ER states chest pressure that started yesterday AM and subsided.  Pt states started again today and got worse instead of better.  PT reports mild SHOB, denies n/v, diaphoresis.  Pt states pain starts at top of chest and moves to abdomen, denies other radiation.  Pt states chest is numb to the touch.  PT states fingers also become numb.  PT A&O x 4, NAD noted, skin warm and dry, resp even and non labored.

## 2020-05-19 NOTE — ED Triage Notes (Signed)
Pt in via [facility] from home with c/o CP. EMS reports pt started with anxiety and hyperventilation and then started with CP. Hx of anxiety, can usually handle it on her own but could not this am. Pt took 324mg  asa prior o EMS arrival. 124/80, HR 60, RR 26, 98% RA, #18 g to left [facility]

## 2020-09-19 ENCOUNTER — Other Ambulatory Visit (HOSPITAL_COMMUNITY)
Admission: RE | Admit: 2020-09-19 | Discharge: 2020-09-19 | Disposition: A | Discharge status: Home / Self Care | Source: Ambulatory Visit | Attending: Family Medicine | Admitting: Family Medicine

## 2020-09-19 ENCOUNTER — Other Ambulatory Visit: Payer: Self-pay | Admitting: Family Medicine

## 2020-09-19 DIAGNOSIS — Z1151 Encounter for screening for human papillomavirus (HPV): Secondary | ICD-10-CM | POA: Diagnosis not present

## 2020-09-19 DIAGNOSIS — Z01411 Encounter for gynecological examination (general) (routine) with abnormal findings: Secondary | ICD-10-CM | POA: Insufficient documentation

## 2020-09-22 ENCOUNTER — Ambulatory Visit
Admission: RE | Admit: 2020-09-22 | Discharge: 2020-09-22 | Disposition: A | Discharge status: Home / Self Care | Source: Ambulatory Visit | Attending: Family Medicine | Admitting: Family Medicine

## 2020-09-22 ENCOUNTER — Other Ambulatory Visit: Payer: Self-pay | Admitting: Family Medicine

## 2020-09-22 ENCOUNTER — Other Ambulatory Visit: Payer: Self-pay

## 2020-09-22 DIAGNOSIS — R928 Other abnormal and inconclusive findings on diagnostic imaging of breast: Secondary | ICD-10-CM

## 2020-09-22 DIAGNOSIS — N6320 Unspecified lump in the left breast, unspecified quadrant: Secondary | ICD-10-CM

## 2020-09-22 LAB — CYTOLOGY - PAP
Comment: NEGATIVE
Diagnosis: NEGATIVE
High risk HPV: NEGATIVE

## 2021-05-14 ENCOUNTER — Encounter: Payer: Self-pay | Admitting: Physician Assistant

## 2021-05-27 DIAGNOSIS — J45909 Unspecified asthma, uncomplicated: Secondary | ICD-10-CM

## 2021-05-27 DIAGNOSIS — F410 Panic disorder [episodic paroxysmal anxiety] without agoraphobia: Secondary | ICD-10-CM | POA: Insufficient documentation

## 2021-05-27 DIAGNOSIS — F419 Anxiety disorder, unspecified: Secondary | ICD-10-CM | POA: Insufficient documentation

## 2021-05-27 DIAGNOSIS — E785 Hyperlipidemia, unspecified: Secondary | ICD-10-CM

## 2021-06-04 ENCOUNTER — Encounter: Payer: Self-pay | Admitting: Physician Assistant

## 2021-06-04 ENCOUNTER — Ambulatory Visit (INDEPENDENT_AMBULATORY_CARE_PROVIDER_SITE_OTHER): Admitting: Physician Assistant

## 2021-06-04 VITALS — BP 120/80 | HR 72 | Ht 64.0 in | Wt 189.5 lb

## 2021-06-04 DIAGNOSIS — Z1211 Encounter for screening for malignant neoplasm of colon: Secondary | ICD-10-CM | POA: Diagnosis not present

## 2021-06-04 DIAGNOSIS — R1010 Upper abdominal pain, unspecified: Secondary | ICD-10-CM

## 2021-06-04 DIAGNOSIS — K625 Hemorrhage of anus and rectum: Secondary | ICD-10-CM | POA: Diagnosis not present

## 2021-06-04 MED ORDER — PLENVU 140 G PO SOLR: 1.0000 | ORAL | 0 refills | Status: DC

## 2021-06-04 MED ORDER — PANTOPRAZOLE SODIUM 40 MG PO TBEC
40.0000 mg | DELAYED_RELEASE_TABLET | Freq: Every day | ORAL | 4 refills | Status: DC
Start: 2021-06-04 — End: 2021-11-02

## 2021-06-04 NOTE — Patient Instructions (Signed)
If you are age 57 or older, your body mass index should be between 23-30. Your Body mass index is 32.53 kg/m. If this is out of the aforementioned range listed, please consider follow up with your Primary Care Provider. ________________________________________________________  The [facility] providers would like to encourage you to use [facility] to communicate with providers for non-urgent requests or questions.  Due to long hold times on the telephone, sending your provider a message by [facility] may be a faster and more efficient way to get a response.  Please allow 48 business hours for a response.  Please remember that this is for non-urgent requests.  _______________________________________________________  Kayla Blake have been scheduled for a colonoscopy. Please follow written instructions given to you at your visit today.  Please pick up your prep supplies at the pharmacy within the next 1-3 days. If you use inhalers (even only as needed), please bring them with you on the day of your procedure.  You have been scheduled for a CT scan of the abdomen and pelvis at [facility] ([address]---this is in the same building as [employer]).   You are scheduled on 06/11/2021 at 2:30 pm. You should arrive 15 minutes prior to your appointment time for registration. Please follow the written instructions below on the day of your exam:  WARNING: IF YOU ARE ALLERGIC TO IODINE/X-RAY DYE, PLEASE NOTIFY RADIOLOGY IMMEDIATELY AT [phone]! YOU WILL BE GIVEN A 13 HOUR PREMEDICATION PREP.  1) Do not eat or drink anything after 10:30 am (4 hours prior to your test) 2) You have been given 2 bottles of oral contrast to drink. The solution may taste better if refrigerated, but do NOT add ice or any other liquid to this solution. Shake well before drinking.    Drink 1 bottle of contrast @ 12:30 pm (2 hours prior to your exam)  Drink 1 bottle of contrast @ 1:30 pm (1 hour prior to your  exam)  You may take any medications as prescribed with a small amount of water, if necessary. If you take any of the following medications: METFORMIN, GLUCOPHAGE, GLUCOVANCE, AVANDAMET, RIOMET, FORTAMET, [place] MET, JANUMET, GLUMETZA or METAGLIP, you MAY be asked to HOLD this medication 48 hours AFTER the exam.  The purpose of you drinking the oral contrast is to aid in the visualization of your intestinal tract. The contrast solution may cause some diarrhea. Depending on your individual set of symptoms, you may also receive an intravenous injection of x-ray contrast/dye. Plan on being at [facility] for 30 minutes or longer, depending on the type of exam you are having performed.  This test typically takes 30-45 minutes to complete.  If you have any questions regarding your exam or if you need to reschedule, you may call the CT department at [phone] between the hours of 8:00 am and 5:00 pm, Monday-Friday.  ____________________________________________________________  We have sent in refills of your Pantoprazole 401 mg 1 tablet in the morning.  Work on decreasing your Alcohol intake.  Follow up pending the results of your CT and Colonoscopy.  Thank you for entrusting me with your care and choosing [facility].  Amy Esterwood, PA-C

## 2021-06-04 NOTE — Progress Notes (Signed)
Subjective:    Patient ID: Kayla Blake, female    DOB: 1966-05-08, 55 y.o.   MRN: [id number]  HPI Kayla Blake is a 55 year old African-American female, known remotely to Dr. Fuller Plan from prior colonoscopy done in 2012 who is referred back today by Alessandra Grout, MD to discuss colonoscopy and also with complaints of constipation and rectal bleeding. Patient says that she had an episode of rectal bleeding a few weeks ago when she was seeing bright red blood and a bit of dark blood primarily on the tissue with normal-appearing bowel movements.  This has since subsided.  She says she did have some soreness in the anal rectal area around that time but no significant pain.  She had also been having some constipation and straining.  At this point though symptoms have all resolved. She had also mention to her PCP that she was having some upper abdominal discomfort and acid reflux symptoms, and was started on Protonix she feels that those symptoms have improved but have not resolved on medication.  He continues to complain of some upper abdominal discomfort has not noticed any change with or without p.o. intake, no heartburn or indigestion, appetite has been okay no weight loss.  No dysphagia or odynophagia.  She is not on any regular NSAIDs.  She does drink about 3 glasses of wine per day long-term Most recent labs showed normal CBC and normal LFTs.  Colonoscopy in October 2012 was done for heme positive stool and iron deficiency and was a normal exam.  Patient is status postcholecystectomy, has history of asthma, anxiety.  Review of Systems Pertinent positive and negative review of systems were noted in the above HPI section.  All other review of systems was otherwise negative.   Outpatient Encounter Medications as of 06/04/2021  Medication Sig   amLODipine (NORVASC) 10 MG tablet Take 10 mg by mouth daily.   clobetasol cream (TEMOVATE) 1.24 % Apply 1 application topically as needed.   hydrochlorothiazide  (HYDRODIURIL) 25 MG tablet Take 25 mg by mouth daily.   Multiple Vitamins-Minerals (MULTIVITAMINS THER. W/MINERALS) TABS tablet Take 1 tablet by mouth daily.   PARoxetine (PAXIL) 40 MG tablet Take 40 mg by mouth daily.   PEG-KCl-NaCl-NaSulf-Na Asc-C (PLENVU) 140 g SOLR Take 1 kit by mouth as directed.   traZODone (DESYREL) 50 MG tablet Take 50 mg by mouth at bedtime as needed for sleep.   [DISCONTINUED] pantoprazole (PROTONIX) 40 MG tablet Take 40 mg by mouth daily.   pantoprazole (PROTONIX) 40 MG tablet Take 1 tablet (40 mg total) by mouth daily.   [DISCONTINUED] albuterol (VENTOLIN HFA) 108 (90 Base) MCG/ACT inhaler Inhale 2 puffs into the lungs every 4 (four) hours as needed for wheezing or shortness of breath.   No facility-administered encounter medications on file as of 06/04/2021.   No Known Allergies Patient Active Problem List   Diagnosis Date Noted   Anxiety 05/27/2021   Panic attacks 05/27/2021   Hyperlipemia 05/27/2021   Asthma 05/27/2021   Pain in right ankle and joints of right foot 05/17/2017   History of fracture of right ankle 05/17/2017   Pain in right elbow 05/17/2017   Breast mass in female 07/01/2015   Iron deficiency anemia, unspecified 03/31/2011   Social History   Socioeconomic History   Marital status: Married    Spouse name: Not on file   Number of children: 2   Years of education: Not on file   Highest education level: Not on file  Occupational History  Occupation: retired  Tobacco Use   Smoking status: Never   Smokeless tobacco: Never  Vaping Use   Vaping Use: Never used  Substance and Sexual Activity   Alcohol use: Yes    Alcohol/week: 18.0 standard drinks    Types: 14 Cans of beer, 4 Standard drinks or equivalent per week    Comment: wine every other day   Drug use: No   Sexual activity: Not on file  Other Topics Concern   Not on file  Social History Narrative   Caffeine use on occasions   Social Determinants of Health   Financial  Resource Strain: Not on file  Food Insecurity: Not on file  Transportation Needs: Not on file  Physical Activity: Not on file  Stress: Not on file  Social Connections: Not on file  Intimate Partner Violence: Not on file    Kayla Blake family history includes Breast cancer in her sister; Colon polyps in her sister; Diabetes in her maternal grandmother and mother; Hypertension in her maternal grandmother and mother; Leukemia in her brother.      Objective:    Vitals:   06/04/21 0929  BP: 120/80  Pulse: 72    Physical Exam.Well-developed well-nourished  AA female  in no acute distress.  Height, Weight 189 , BMI 32.5  HEENT; nontraumatic normocephalic, EOMI, PE R LA, sclera anicteric. Oropharynx; not examined Neck; supple, no JVD Cardiovascular; regular rate and rhythm with S1-S2, no murmur rub or gallop Pulmonary; Clear bilaterally Abdomen; soft, there is tenderness across the upper abdomen, no guarding, left lobe of the liver is palpable down to 3 finger breaths from costal margin non distended, no palpable mass or hepatosplenomegaly, bowel sounds are active Rectal; not done Skin; benign exam, no jaundice rash or appreciable lesions Extremities; no clubbing cyanosis or edema skin warm and dry Neuro/Psych; alert and oriented x4, grossly nonfocal mood and affect appropriate        Assessment & Plan:   #51 55 year old African-American female with recent episode of small-volume hematochezia which lasted for couple of days and occurred only with bowel movements and was associated with some mild anal rectal discomfort. I suspect her bleeding may have been secondary to hemorrhoids that were aggravated by constipation. Cannot rule out occult colon lesion, proctitis or other inflammatory process  #2 colon cancer screening-normal colonoscopy 2012 due for follow-up  #3 upper abdominal discomfort x3 to 4 months some improvement on PPI therapy but still with significant tenderness on  exam, need to rule out other intra-abdominal inflammatory process, also consider peptic ulcer disease, pancreatitis  #4 status post cholecystectomy #5 regular EtOH use 6.  Asthma  Plan; Patient will be scheduled for CT scan of the abdomen pelvis with contrast. Refill Protonix 40 mg p.o. every morning AC breakfast. We discussed decreasing EtOH intake to no more than 1 drink per day. Patient will be scheduled for colonoscopy with Dr. Fuller Plan.  Procedure was discussed in detail with patient including indications risk and benefits and she is agreeable to proceed. As of today's visit patient is appropriate for endoscopic evaluation in the ambulatory care setting.    Kayla Fei S Kayla Bocchino PA-C 06/04/2021   Cc: Jonathon Jordan, MD

## 2021-06-04 NOTE — Progress Notes (Signed)
Reviewed and agree with management plan.  Anysia Choi T. Kaymen Adrian, MD FACG 

## 2021-06-11 ENCOUNTER — Ambulatory Visit (INDEPENDENT_AMBULATORY_CARE_PROVIDER_SITE_OTHER)
Admission: RE | Admit: 2021-06-11 | Discharge: 2021-06-11 | Disposition: A | Discharge status: Home / Self Care | Source: Ambulatory Visit | Attending: Physician Assistant | Admitting: Physician Assistant

## 2021-06-11 ENCOUNTER — Other Ambulatory Visit: Payer: Self-pay

## 2021-06-11 DIAGNOSIS — R1010 Upper abdominal pain, unspecified: Secondary | ICD-10-CM | POA: Diagnosis not present

## 2021-06-11 MED ORDER — IOHEXOL 350 MG/ML SOLN
80.0000 mL | Freq: Once | INTRAVENOUS | Status: AC | PRN
  Administered 2021-06-11: 80 mL via INTRAVENOUS

## 2021-06-16 ENCOUNTER — Other Ambulatory Visit (INDEPENDENT_AMBULATORY_CARE_PROVIDER_SITE_OTHER)

## 2021-06-16 ENCOUNTER — Other Ambulatory Visit: Payer: Self-pay

## 2021-06-16 ENCOUNTER — Telehealth: Payer: Self-pay

## 2021-06-16 DIAGNOSIS — E876 Hypokalemia: Secondary | ICD-10-CM | POA: Diagnosis not present

## 2021-06-16 LAB — BASIC METABOLIC PANEL
BUN: 14 mg/dL (ref 6–23)
CO2: 32 mEq/L (ref 19–32)
Calcium: 9.7 mg/dL (ref 8.4–10.5)
Chloride: 101 mEq/L (ref 96–112)
Creatinine, Ser: 0.98 mg/dL (ref 0.40–1.20)
GFR: 64.09 mL/min (ref >60.00)
Glucose, Bld: 93 mg/dL (ref 70–99)
Potassium: 2.8 mEq/L — CL (ref 3.5–5.1)
Sodium: 141 mEq/L (ref 135–145)

## 2021-06-16 MED ORDER — POTASSIUM CHLORIDE ER 20 MEQ PO TBCR
20.0000 meq | EXTENDED_RELEASE_TABLET | Freq: Every day | ORAL | 0 refills | Status: DC
Start: 2021-06-16 — End: 2021-07-13

## 2021-06-16 NOTE — Telephone Encounter (Signed)
I do not have any procedure appts open tomorrow. Please schedule an EGD on another day or reschedule colon/EGD on another day.

## 2021-06-16 NOTE — Telephone Encounter (Signed)
Patient seen by Nicoletta Ba for abdominal pain and rectal bleeding. Her CT was normal. The Protonix has not changed or decreased her abdominal pain. She is scheduled for the colonoscopy tomorrow. She is interested in having an EGD with the colonoscopy.  Please advise.

## 2021-06-16 NOTE — Telephone Encounter (Signed)
Patient does not want to wait until January, next available for a double procedure. She does not want to have 2 separate procedures. Agreeable to asking a different provider if someone is available.  Dr Silverio Decamp contacted and agrees to accept the case. Patient will go for Bmet today. Procedure date an time will remain the same. [facility] staff notified of the change. Patient thanks me for arranging this for her.

## 2021-06-17 ENCOUNTER — Encounter: Admitting: Gastroenterology

## 2021-07-10 ENCOUNTER — Other Ambulatory Visit: Payer: Self-pay

## 2021-07-10 ENCOUNTER — Other Ambulatory Visit (INDEPENDENT_AMBULATORY_CARE_PROVIDER_SITE_OTHER)

## 2021-07-10 DIAGNOSIS — E876 Hypokalemia: Secondary | ICD-10-CM

## 2021-07-10 LAB — BASIC METABOLIC PANEL
BUN: 16 mg/dL (ref 6–23)
CO2: 29 mEq/L (ref 19–32)
Calcium: 10 mg/dL (ref 8.4–10.5)
Chloride: 101 mEq/L (ref 96–112)
Creatinine, Ser: 0.88 mg/dL (ref 0.40–1.20)
GFR: 72.89 mL/min (ref >60.00)
Glucose, Bld: 116 mg/dL — ABNORMAL HIGH (ref 70–99)
Potassium: 3 mEq/L — ABNORMAL LOW (ref 3.5–5.1)
Sodium: 140 mEq/L (ref 135–145)

## 2021-07-13 ENCOUNTER — Other Ambulatory Visit: Payer: Self-pay

## 2021-07-13 DIAGNOSIS — D509 Iron deficiency anemia, unspecified: Secondary | ICD-10-CM

## 2021-07-13 DIAGNOSIS — E876 Hypokalemia: Secondary | ICD-10-CM

## 2021-07-13 MED ORDER — POTASSIUM CHLORIDE CRYS ER 20 MEQ PO TBCR: 20.0000 meq | EXTENDED_RELEASE_TABLET | Freq: Two times a day (BID) | ORAL | 0 refills | Status: AC
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
## 2021-07-14 ENCOUNTER — Encounter: Payer: Self-pay | Admitting: Physician Assistant

## 2021-07-14 ENCOUNTER — Ambulatory Visit (INDEPENDENT_AMBULATORY_CARE_PROVIDER_SITE_OTHER): Admitting: Physician Assistant

## 2021-07-14 ENCOUNTER — Other Ambulatory Visit (INDEPENDENT_AMBULATORY_CARE_PROVIDER_SITE_OTHER)

## 2021-07-14 ENCOUNTER — Other Ambulatory Visit: Payer: Self-pay

## 2021-07-14 VITALS — BP 140/83 | HR 75 | Ht 65.0 in | Wt 189.0 lb

## 2021-07-14 DIAGNOSIS — R413 Other amnesia: Secondary | ICD-10-CM

## 2021-07-14 DIAGNOSIS — F1010 Alcohol abuse, uncomplicated: Secondary | ICD-10-CM

## 2021-07-14 DIAGNOSIS — G3184 Mild cognitive impairment, so stated: Secondary | ICD-10-CM | POA: Diagnosis not present

## 2021-07-14 LAB — VITAMIN B12: Vitamin B-12: 620 pg/mL (ref 211–911)

## 2021-07-14 LAB — TSH: TSH: 1.88 u[IU]/mL (ref 0.35–5.50)

## 2021-07-14 NOTE — Patient Instructions (Addendum)
It was a pleasure to see you today at our office.   Recommendations:  Neurocognitive evaluation at our office MRI of the brain, the radiology office will call you to arrange you appointment Check labs today Follow up after neurocognitive testing Please target your alcohol consumption  RECOMMENDATIONS FOR ALL PATIENTS WITH MEMORY PROBLEMS: 1. Continue to exercise (Recommend 30 minutes of walking everyday, or 3 hours every week) 2. Increase social interactions - continue going to [place] and enjoy social gatherings with friends and family 3. Eat healthy, avoid fried foods and eat more fruits and vegetables 4. Maintain adequate blood pressure, blood sugar, and blood cholesterol level. Reducing the risk of stroke and cardiovascular disease also helps promoting better memory. 5. Avoid stressful situations. Live a simple life and avoid aggravations. Organize your time and prepare for the next day in anticipation. 6. Sleep well, avoid any interruptions of sleep and avoid any distractions in the bedroom that may interfere with adequate sleep quality 7. Avoid sugar, avoid sweets as there is a strong link between excessive sugar intake, diabetes, and cognitive impairment We discussed the Mediterranean diet, which has been shown to help patients reduce the risk of progressive memory disorders and reduces cardiovascular risk. This includes eating fish, eat fruits and green leafy vegetables, nuts like almonds and hazelnuts, walnuts, and also use olive oil. Avoid fast foods and fried foods as much as possible. Avoid sweets and sugar as sugar use has been linked to worsening of memory function.  There is always a concern of gradual progression of memory problems. If this is the case, then we may need to adjust level of care according to patient needs. Support, both to the patient and caregiver, should then be put into place.      You have been referred for a neuropsychological evaluation (i.e., evaluation  of memory and thinking abilities). Please bring someone with you to this appointment if possible, as it is helpful for the doctor to hear from both you and another adult who knows you well. Please bring eyeglasses and hearing aids if you wear them.    The evaluation will take approximately 3 hours and has two parts:   The first part is a clinical interview with the neuropsychologist (Dr. Melvyn Novas or Dr. Nicole Kindred). During the interview, the neuropsychologist will speak with you and the individual you brought to the appointment.    The second part of the evaluation is testing with the doctor's technician Hinton Dyer or Maudie Mercury). During the testing, the technician will ask you to remember different types of material, solve problems, and answer some questionnaires. Your family member will not be present for this portion of the evaluation.   Please note: We must reserve several hours of the neuropsychologist's time and the psychometrician's time for your evaluation appointment. As such, there is a No-Show fee of $100. If you are unable to attend any of your appointments, please contact our office as soon as possible to reschedule.    FALL PRECAUTIONS: Be cautious when walking. Scan the area for obstacles that may increase the risk of trips and falls. When getting up in the mornings, sit up at the edge of the bed for a few minutes before getting out of bed. Consider elevating the bed at the head end to avoid drop of blood pressure when getting up. Walk always in a well-lit room (use night lights in the walls). Avoid area rugs or power cords from appliances in the middle of the walkways. Use a walker or  a cane if necessary and consider physical therapy for balance exercise. Get your eyesight checked regularly.  FINANCIAL OVERSIGHT: Supervision, especially oversight when making financial decisions or transactions is also recommended.  HOME SAFETY: Consider the safety of the kitchen when operating appliances like stoves,  microwave oven, and blender. Consider having supervision and share cooking responsibilities until no longer able to participate in those. Accidents with firearms and other hazards in the house should be identified and addressed as well.   ABILITY TO BE LEFT ALONE: If patient is unable to contact 911 operator, consider using LifeLine, or when the need is there, arrange for someone to stay with patients. Smoking is a fire hazard, consider supervision or cessation. Risk of wandering should be assessed by caregiver and if detected at any point, supervision and safe proof recommendations should be instituted.  MEDICATION SUPERVISION: Inability to self-administer medication needs to be constantly addressed. Implement a mechanism to ensure safe administration of the medications.   DRIVING: Regarding driving, in patients with progressive memory problems, driving will be impaired. We advise to have someone else do the driving if trouble finding directions or if minor accidents are reported. Independent driving assessment is available to determine safety of driving.   If you are interested in the driving assessment, you can contact the following:  [employer] in [place]  [place] [phone] or [phone]    [place] refers to food and lifestyle choices that are based on the traditions of countries located on the [employer]. This way of eating has been shown to help prevent certain conditions and improve outcomes for people who have chronic diseases, like kidney disease and heart disease. What are tips for following this plan? Lifestyle  Cook and eat meals together with your family, when possible. Drink enough fluid to keep your urine clear or pale yellow. Be physically active every day. This includes: Aerobic exercise like running or  swimming. Leisure activities like gardening, walking, or housework. Get 7-8 hours of sleep each night. If recommended by your health care provider, drink red wine in moderation. This means 1 glass a day for nonpregnant women and 2 glasses a day for men. A glass of wine equals 5 oz (150 mL). Reading food labels  Check the serving size of packaged foods. For foods such as rice and pasta, the serving size refers to the amount of cooked product, not dry. Check the total fat in packaged foods. Avoid foods that have saturated fat or trans fats. Check the ingredients list for added sugars, such as corn syrup. Shopping  At the grocery store, buy most of your food from the areas near the walls of the store. This includes: Fresh fruits and vegetables (produce). Grains, beans, nuts, and seeds. Some of these may be available in unpackaged forms or large amounts (in bulk). Fresh seafood. Poultry and eggs. Low-fat dairy products. Buy whole ingredients instead of prepackaged foods. Buy fresh fruits and vegetables in-season from local farmers markets. Buy frozen fruits and vegetables in resealable bags. If you do not have access to quality fresh seafood, buy precooked frozen shrimp or canned fish, such as tuna, salmon, or sardines. Buy small amounts of raw or cooked vegetables, salads, or olives from the deli or salad bar at your store. Stock your pantry so you always have certain foods on hand, such as olive oil, canned tuna, canned tomatoes, rice, pasta, and beans. Cooking  Cook foods with extra-virgin olive oil instead of using butter or other vegetable oils. Have meat as a side dish, and have vegetables or grains as your main dish. This means having meat in small portions or adding small amounts of meat to foods like pasta or stew. Use beans or vegetables instead of meat in common dishes like chili or lasagna. Experiment with different cooking methods. Try roasting or broiling vegetables instead of  steaming or sauteing them. Add frozen vegetables to soups, stews, pasta, or rice. Add nuts or seeds for added healthy fat at each meal. You can add these to yogurt, salads, or vegetable dishes. Marinate fish or vegetables using olive oil, lemon juice, garlic, and fresh herbs. Meal planning  Plan to eat 1 vegetarian meal one day each week. Try to work up to 2 vegetarian meals, if possible. Eat seafood 2 or more times a week. Have healthy snacks readily available, such as: Vegetable sticks with hummus. Greek yogurt. Fruit and nut trail mix. Eat balanced meals throughout the week. This includes: Fruit: 2-3 servings a day Vegetables: 4-5 servings a day Low-fat dairy: 2 servings a day Fish, poultry, or lean meat: 1 serving a day Beans and legumes: 2 or more servings a week Nuts and seeds: 1-2 servings a day Whole grains: 6-8 servings a day Extra-virgin olive oil: 3-4 servings a day Limit red meat and sweets to only a few servings a month What are my food choices? Mediterranean diet Recommended Grains: Whole-grain pasta. Brown rice. Bulgar wheat. Polenta. Couscous. Whole-wheat bread. [place]. Vegetables: Artichokes. Beets. Broccoli. Cabbage. Carrots. Eggplant. Green beans. Chard. Kale. Spinach. Onions. Leeks. Peas. Squash. Tomatoes. Peppers. Radishes. Fruits: Apples. Apricots. Avocado. Berries. Bananas. Cherries. Dates. Figs. Grapes. Lemons. Melon. Oranges. Peaches. Plums. Pomegranate. Meats and other protein foods: Beans. Almonds. Sunflower seeds. Pine nuts. Peanuts. [place]. Salmon. Scallops. Shrimp. [place]. Tilapia. Clams. Oysters. Eggs. Dairy: Low-fat milk. Cheese. Greek yogurt. Beverages: Water. Red wine. Herbal tea. Fats and oils: Extra virgin olive oil. Avocado oil. Grape seed oil. Sweets and desserts: [place] yogurt with honey. Baked apples. Poached pears. Trail mix. Seasoning and other foods: Basil. Cilantro. Coriander. Cumin. Mint. Parsley. Sage. Rosemary. Tarragon. Garlic.  Oregano. Thyme. Pepper. Balsalmic vinegar. Tahini. Hummus. Tomato sauce. Olives. Mushrooms. Limit these Grains: Prepackaged pasta or rice dishes. Prepackaged cereal with added sugar. Vegetables: Deep fried potatoes (french fries). Fruits: Fruit canned in syrup. Meats and other protein foods: Beef. Pork. Lamb. Poultry with skin. Hot dogs. Berniece Salines. Dairy: Ice cream. Sour cream. Whole milk. Beverages: Juice. Sugar-sweetened soft drinks. Beer. Liquor and spirits. Fats and oils: Butter. Canola oil. Vegetable oil. Beef fat (tallow). Lard. Sweets and desserts: Cookies. Cakes. Pies. Candy. Seasoning and other foods: Mayonnaise. Premade sauces and marinades. The items listed may not be a complete list. Talk with your dietitian about what dietary choices are right for you. Summary The Mediterranean diet includes both food and lifestyle choices. Eat a variety of fresh fruits and vegetables, beans, nuts, seeds, and whole grains. Limit the amount of red meat and sweets that you eat. Talk with your health care provider about whether it is safe for you to drink red wine in moderation. This means 1 glass a day for nonpregnant women and 2 glasses a day for men. A glass of wine equals 5 oz (150 mL). This information is not intended to replace advice given to you by your health care provider. Make sure you discuss any questions you have with your health care provider. Document Released: 03/11/2016 Document  Revised: 04/13/2016 Document Reviewed: 03/11/2016 Elsevier Interactive Patient Education  2017 [employer].

## 2021-07-15 ENCOUNTER — Other Ambulatory Visit: Payer: Self-pay

## 2021-07-15 ENCOUNTER — Other Ambulatory Visit (INDEPENDENT_AMBULATORY_CARE_PROVIDER_SITE_OTHER)

## 2021-07-15 DIAGNOSIS — E876 Hypokalemia: Secondary | ICD-10-CM

## 2021-07-15 LAB — POTASSIUM: Potassium: 3.8 mEq/L (ref 3.5–5.1)

## 2021-07-15 LAB — MAGNESIUM: Magnesium: 2.1 mg/dL (ref 1.5–2.5)

## 2021-07-15 LAB — PHOSPHORUS: Phosphorus: 3.3 mg/dL (ref 2.3–4.6)

## 2021-07-16 ENCOUNTER — Encounter: Payer: Self-pay | Admitting: Gastroenterology

## 2021-07-16 ENCOUNTER — Other Ambulatory Visit: Payer: Self-pay

## 2021-07-16 ENCOUNTER — Ambulatory Visit (AMBULATORY_SURGERY_CENTER): Admitting: Gastroenterology

## 2021-07-16 VITALS — BP 126/78 | HR 64 | Temp 97.7°F | Resp 17 | Ht 64.0 in | Wt 189.0 lb

## 2021-07-16 DIAGNOSIS — D120 Benign neoplasm of cecum: Secondary | ICD-10-CM

## 2021-07-16 DIAGNOSIS — K2970 Gastritis, unspecified, without bleeding: Secondary | ICD-10-CM | POA: Diagnosis not present

## 2021-07-16 DIAGNOSIS — D123 Benign neoplasm of transverse colon: Secondary | ICD-10-CM

## 2021-07-16 DIAGNOSIS — R1010 Upper abdominal pain, unspecified: Secondary | ICD-10-CM

## 2021-07-16 DIAGNOSIS — K921 Melena: Secondary | ICD-10-CM | POA: Diagnosis not present

## 2021-07-16 MED ORDER — SODIUM CHLORIDE 0.9 % IV SOLN: 500.0000 mL | Freq: Once | INTRAVENOUS | Status: DC

## 2021-07-16 NOTE — Op Note (Signed)
[facility] Patient Name: Kayla Blake Procedure Date: 07/16/2021 2:10 PM MRN: [id number] Endoscopist: Ladene Artist , MD Age: 55 Referring MD:  Date of Birth: [birth year] Gender: Female Account #: [id number] Procedure:                Upper GI endoscopy Indications:              Upper abdominal pain Medicines:                Monitored Anesthesia Care Procedure:                Pre-Anesthesia Assessment:                           - Prior to the procedure, a History and Physical                            was performed, and patient medications and                            allergies were reviewed. The patient's tolerance of                            previous anesthesia was also reviewed. The risks                            and benefits of the procedure and the sedation                            options and risks were discussed with the patient.                            All questions were answered, and informed consent                            was obtained. Prior Anticoagulants: The patient has                            taken no previous anticoagulant or antiplatelet                            agents. ASA Grade Assessment: II - A patient with                            mild systemic disease. After reviewing the risks                            and benefits, the patient was deemed in                            satisfactory condition to undergo the procedure.                           After obtaining informed consent, the endoscope was  passed under direct vision. Throughout the                            procedure, the patient's blood pressure, pulse, and                            oxygen saturations were monitored continuously. The                            Olympus GIF-[id number] #[id number] was introduced through                            the mouth, and advanced to the second part of                            duodenum. The upper GI endoscopy was  accomplished                            without difficulty. The patient tolerated the                            procedure well. Scope In: Scope Out: Findings:                 The examined esophagus was normal.                           Diffuse moderate inflammation characterized by                            erythema, friability and granularity was found in                            the gastric body. Biopsies were taken with a cold                            forceps for histology.                           The exam of the stomach was otherwise normal.                           The duodenal bulb and second portion of the                            duodenum were normal. Complications:            No immediate complications. Estimated Blood Loss:     Estimated blood loss was minimal. Impression:               - Normal esophagus.                           - Gastritis. Biopsied.                           - Normal duodenal bulb and second  portion of the                            duodenum. Recommendation:           - Patient has a contact number available for                            emergencies. The signs and symptoms of potential                            delayed complications were discussed with the                            patient. Return to normal activities tomorrow.                            Written discharge instructions were provided to the                            patient.                           - Resume previous diet.                           - Continue present medications.                           - Await pathology results.                           - GI office appt in 6 weeks with me or Amy                            [place], PA-C. Ladene Artist, MD 07/16/2021 2:47:26 PM This report has been signed electronically.

## 2021-07-16 NOTE — Progress Notes (Signed)
VS- Kayla Blake. °

## 2021-07-16 NOTE — Progress Notes (Signed)
Called to room to assist during endoscopic procedure.  Patient ID and intended procedure confirmed with present staff. Received instructions for my participation in the procedure from the performing physician.  

## 2021-07-16 NOTE — Patient Instructions (Signed)
Handouts provided on polyps, hemorrhoids and gastritis.   GI office appointment in 6 weeks with Dr. Fuller Plan or Nicoletta Ba, PA-C. My office will contact you to schedule an appointment.   Preparation H suppository (over-the-counter) on per rectum daily as needed for hemorrhoid symptoms.   YOU HAD AN ENDOSCOPIC PROCEDURE TODAY AT [facility]:   Refer to the procedure report that was given to you for any specific questions about what was found during the examination.  If the procedure report does not answer your questions, please call your gastroenterologist to clarify.  If you requested that your care partner not be given the details of your procedure findings, then the procedure report has been included in a sealed envelope for you to review at your convenience later.  YOU SHOULD EXPECT: Some feelings of bloating in the abdomen. Passage of more gas than usual.  Walking can help get rid of the air that was put into your GI tract during the procedure and reduce the bloating. If you had a lower endoscopy (such as a colonoscopy or flexible sigmoidoscopy) you may notice spotting of blood in your stool or on the toilet paper. If you underwent a bowel prep for your procedure, you may not have a normal bowel movement for a few days.  Please Note:  You might notice some irritation and congestion in your nose or some drainage.  This is from the oxygen used during your procedure.  There is no need for concern and it should clear up in a day or so.  SYMPTOMS TO REPORT IMMEDIATELY:  Following lower endoscopy (colonoscopy or flexible sigmoidoscopy):  Excessive amounts of blood in the stool  Significant tenderness or worsening of abdominal pains  Swelling of the abdomen that is new, acute  Fever of 100F or higher  Following upper endoscopy (EGD)  Vomiting of blood or coffee ground material  New chest pain or pain under the shoulder blades  Painful or persistently difficult swallowing  New  shortness of breath  Fever of 100F or higher  Black, tarry-looking stools  For urgent or emergent issues, a gastroenterologist can be reached at any hour by calling [phone]. Do not use MyChart messaging for urgent concerns.    DIET:  We do recommend a small meal at first, but then you may proceed to your regular diet.  Drink plenty of fluids but you should avoid alcoholic beverages for 24 hours.  ACTIVITY:  You should plan to take it easy for the rest of today and you should NOT DRIVE or use heavy machinery until tomorrow (because of the sedation medicines used during the test).    FOLLOW UP: Our staff will call the number listed on your records 48-72 hours following your procedure to check on you and address any questions or concerns that you may have regarding the information given to you following your procedure. If we do not reach you, we will leave a message.  We will attempt to reach you two times.  During this call, we will ask if you have developed any symptoms of COVID 19. If you develop any symptoms (ie: fever, flu-like symptoms, shortness of breath, cough etc.) before then, please call [phone].  If you test positive for Covid 19 in the 2 weeks post procedure, please call and report this information to [place].    If any biopsies were taken you will be contacted by phone or by letter within the next 1-3 weeks.  Please call us at [phone]  [phone] if you have not heard about the biopsies in 3 weeks.    SIGNATURES/CONFIDENTIALITY: You and/or your care partner have signed paperwork which will be entered into your electronic medical record.  These signatures attest to the fact that that the information above on your After Visit Summary has been reviewed and is understood.  Full responsibility of the confidentiality of this discharge information lies with you and/or your care-partner.

## 2021-07-16 NOTE — Progress Notes (Signed)
To pacu, VSS. Report to [email or web address] °

## 2021-07-16 NOTE — Progress Notes (Signed)
History & Physical  Primary Care Physician:  Jonathon Jordan, MD Primary Gastroenterologist: Lucio Edward, MD  CHIEF COMPLAINT:  Small-volume hematochezia, upper abdominal pain   HPI: Kayla Blake is a 55 y.o. female with small-volume hematochezia and upper abdominal pain for colonoscopy and EGD.    Past Medical History:  Diagnosis Date   Alcohol abuse    Allergic rhinitis    Anxiety    Arthritis    GERD (gastroesophageal reflux disease)    Heart murmur    Hyperlipidemia    Hypertension    Hypothyroidism    IBS (irritable bowel syndrome)    Macrocytosis    Uterine fibroids affecting pregnancy    Vitamin D deficiency     Past Surgical History:  Procedure Laterality Date   BREAST EXCISIONAL BIOPSY     CHOLECYSTECTOMY     COLONOSCOPY     ORIF ANKLE FRACTURE Right 05/15/2015   Procedure: OPEN REDUCTION INTERNAL FIXATION (ORIF) ANKLE FRACTURE;  Surgeon: Newt Minion, MD;  Location: [facility];  Service: Orthopedics;  Laterality: Right;   RADIOACTIVE SEED GUIDED EXCISIONAL BREAST BIOPSY Right 07/16/2015   Procedure: RADIOACTIVE SEED GUIDED EXCISIONAL BREAST BIOPSY;  Surgeon: Rolm Bookbinder, MD;  Location: [facility];  Service: General;  Laterality: Right;   TUBAL LIGATION      Prior to Admission medications   Medication Sig Start Date End Date Taking? Authorizing Provider  amLODipine (NORVASC) 10 MG tablet Take 10 mg by mouth daily. 05/06/15  Yes [provider]  hydrochlorothiazide (HYDRODIURIL) 25 MG tablet Take 25 mg by mouth daily. 05/06/15  Yes [provider]  Multiple Vitamins-Minerals (MULTIVITAMINS THER. W/MINERALS) TABS tablet Take 1 tablet by mouth daily.   Yes [provider]  pantoprazole (PROTONIX) 40 MG tablet Take 1 tablet (40 mg total) by mouth daily. 06/04/21  Yes Esterwood, Amy S, PA-C  PARoxetine (PAXIL) 40 MG tablet Take 40 mg by mouth daily. 04/27/15  Yes [provider]  potassium chloride SA  (KLOR-CON M) 20 MEQ tablet Take 1 tablet (20 mEq total) by mouth 2 (two) times daily for 5 days. 07/13/21 07/18/21 Yes Esterwood, Amy S, PA-C  traZODone (DESYREL) 50 MG tablet Take 50 mg by mouth at bedtime as needed for sleep.   Yes [provider]  clobetasol cream (TEMOVATE) 6.78 % Apply 1 application topically as needed. 02/13/21   [provider]    Current Outpatient Medications  Medication Sig Dispense Refill   amLODipine (NORVASC) 10 MG tablet Take 10 mg by mouth daily.     hydrochlorothiazide (HYDRODIURIL) 25 MG tablet Take 25 mg by mouth daily.     Multiple Vitamins-Minerals (MULTIVITAMINS THER. W/MINERALS) TABS tablet Take 1 tablet by mouth daily.     pantoprazole (PROTONIX) 40 MG tablet Take 1 tablet (40 mg total) by mouth daily. 30 tablet 4   PARoxetine (PAXIL) 40 MG tablet Take 40 mg by mouth daily.     potassium chloride SA (KLOR-CON M) 20 MEQ tablet Take 1 tablet (20 mEq total) by mouth 2 (two) times daily for 5 days. 10 tablet 0   traZODone (DESYREL) 50 MG tablet Take 50 mg by mouth at bedtime as needed for sleep.     clobetasol cream (TEMOVATE) 9.38 % Apply 1 application topically as needed.     Current Facility-Administered Medications  Medication Dose Route Frequency Provider Last Rate Last Admin   0.9 %  sodium chloride infusion  500 mL Intravenous Once Ladene Artist, MD  Allergies as of 07/16/2021   (No Known Allergies)    Family History  Problem Relation Age of Onset   Diabetes Mother    Hypertension Mother    Breast cancer Sister    Colon polyps Sister    Leukemia Brother    Diabetes Maternal Grandmother    Hypertension Maternal Grandmother    Colon cancer Neg Hx    Esophageal cancer Neg Hx    Rectal cancer Neg Hx    Stomach cancer Neg Hx     Social History   Socioeconomic History   Marital status: Married    Spouse name: Not on file   Number of children: 2   Years of education: Not on file   Highest education level: Not  on file  Occupational History   Occupation: retired  Tobacco Use   Smoking status: Never   Smokeless tobacco: Never  Vaping Use   Vaping Use: Never used  Substance and Sexual Activity   Alcohol use: Yes    Alcohol/week: 18.0 standard drinks    Types: 14 Cans of beer, 4 Standard drinks or equivalent per week    Comment: wine every other day   Drug use: No   Sexual activity: Not on file  Other Topics Concern   Not on file  Social History Narrative   Caffeine use on occasions   Right Handed    Lives in a two story home    Social Determinants of Health   Financial Resource Strain: Not on file  Food Insecurity: Not on file  Transportation Needs: Not on file  Physical Activity: Not on file  Stress: Not on file  Social Connections: Not on file  Intimate Partner Violence: Not on file    Review of Systems:  All systems reviewed an negative except where noted in HPI.  Gen: Denies any fever, chills, sweats, anorexia, fatigue, weakness, malaise, weight loss, and sleep disorder CV: Denies chest pain, angina, palpitations, syncope, orthopnea, PND, peripheral edema, and claudication. Resp: Denies dyspnea at rest, dyspnea with exercise, cough, sputum, wheezing, coughing up blood, and pleurisy. GI: Denies vomiting blood, jaundice, and fecal incontinence.   Denies dysphagia or odynophagia. GU : Denies urinary burning, blood in urine, urinary frequency, urinary hesitancy, nocturnal urination, and urinary incontinence. MS: Denies joint pain, limitation of movement, and swelling, stiffness, low back pain, extremity pain. Denies muscle weakness, cramps, atrophy.  Derm: Denies rash, itching, dry skin, hives, moles, warts, or unhealing ulcers.  Psych: Denies depression, anxiety, memory loss, suicidal ideation, hallucinations, paranoia, and confusion. Heme: Denies bruising, bleeding, and enlarged lymph nodes. Neuro:  Denies any headaches, dizziness, paresthesias. Endo:  Denies any problems with  DM, thyroid, adrenal function.   Physical Exam: General:  Alert, well-developed, in NAD Head:  Normocephalic and atraumatic. Eyes:  Sclera clear, no icterus.   Conjunctiva pink. Ears:  Normal auditory acuity. Mouth:  No deformity or lesions.  Neck:  Supple; no masses . Lungs:  Clear throughout to auscultation.   No wheezes, crackles, or rhonchi. No acute distress. Heart:  Regular rate and rhythm; no murmurs. Abdomen:  Soft, nondistended, nontender. No masses, hepatomegaly. No obvious masses.  Normal bowel .    Rectal:  Deferred   Msk:  Symmetrical without gross deformities.. Pulses:  Normal pulses noted. Extremities:  Without edema. Neurologic:  Alert and  oriented x4;  grossly normal neurologically. Skin:  Intact without significant lesions or rashes. Cervical Nodes:  No significant cervical adenopathy. Psych:  Alert and cooperative. Normal mood and  affect.   Impression / Plan:   Small-volume hematochezia and upper abdominal pain for colonoscopy and EGD.   Pricilla Riffle. Fuller Plan  07/16/2021, 2:12 PM See Shea Evans, [facility], to contact our on call provider

## 2021-07-16 NOTE — Op Note (Signed)
[facility] Patient Name: Kayla Blake Procedure Date: 07/16/2021 2:11 PM MRN: [id number] Endoscopist: Ladene Artist , MD Age: 55 Referring MD:  Date of Birth: July 08, 1966 Gender: Female Account #: [id number] Procedure:                Colonoscopy Indications:              Hematochezia Medicines:                Monitored Anesthesia Care Procedure:                Pre-Anesthesia Assessment:                           - Prior to the procedure, a History and Physical                            was performed, and patient medications and                            allergies were reviewed. The patient's tolerance of                            previous anesthesia was also reviewed. The risks                            and benefits of the procedure and the sedation                            options and risks were discussed with the patient.                            All questions were answered, and informed consent                            was obtained. Prior Anticoagulants: The patient has                            taken no previous anticoagulant or antiplatelet                            agents. ASA Grade Assessment: II - A patient with                            mild systemic disease. After reviewing the risks                            and benefits, the patient was deemed in                            satisfactory condition to undergo the procedure.                           After obtaining informed consent, the colonoscope  was passed under direct vision. Throughout the                            procedure, the patient's blood pressure, pulse, and                            oxygen saturations were monitored continuously. The                            Olympus CF-HQ190L [id number]) Colonoscope was                            introduced through the anus and advanced to the the                            cecum, identified by appendiceal orifice and                             ileocecal valve. The ileocecal valve, appendiceal                            orifice, and rectum were photographed. The quality                            of the bowel preparation was excellent. The                            colonoscopy was performed without difficulty. The                            patient tolerated the procedure well. Scope In: 2:15:57 PM Scope Out: 2:33:09 PM Scope Withdrawal Time: 0 hours 13 minutes 49 seconds  Total Procedure Duration: 0 hours 17 minutes 12 seconds  Findings:                 The perianal and digital rectal examinations were                            normal.                           Two sessile polyps were found in the transverse                            colon and cecum. The polyps were 6 to 7 mm in size.                            These polyps were removed with a cold snare.                            Resection and retrieval were complete.                           Internal hemorrhoids were found during  retroflexion. The hemorrhoids were small and Grade                            I (internal hemorrhoids that do not prolapse).                           The exam was otherwise without abnormality on                            direct and retroflexion views. Complications:            No immediate complications. Estimated blood loss:                            None. Estimated Blood Loss:     Estimated blood loss: none. Impression:               - Two 6 to 7 mm polyps in the transverse colon and                            in the cecum, removed with a cold snare. Resected                            and retrieved.                           - Internal hemorrhoids.                           - The examination was otherwise normal on direct                            and retroflexion views. Recommendation:           - Repeat colonoscopy after studies are complete for                             surveillance based on pathology results.                           - Patient has a contact number available for                            emergencies. The signs and symptoms of potential                            delayed complications were discussed with the                            patient. Return to normal activities tomorrow.                            Written discharge instructions were provided to the                            patient.                           -  Resume previous diet.                           - Continue present medications.                           - Preparation H supp (OTC) one PR qd prn hemorrhoid                            symptoms.                           - Await pathology results. Ladene Artist, MD 07/16/2021 2:35:59 PM This report has been signed electronically.

## 2021-07-19 LAB — VITAMIN B1: Vitamin B1 (Thiamine): 6 nmol/L — ABNORMAL LOW (ref 8–30)

## 2021-07-20 ENCOUNTER — Ambulatory Visit
Admission: RE | Admit: 2021-07-20 | Discharge: 2021-07-20 | Disposition: A | Discharge status: Home / Self Care | Source: Ambulatory Visit | Attending: Physician Assistant | Admitting: Physician Assistant

## 2021-07-20 ENCOUNTER — Other Ambulatory Visit: Payer: Self-pay

## 2021-07-20 ENCOUNTER — Telehealth: Payer: Self-pay

## 2021-07-20 DIAGNOSIS — R413 Other amnesia: Secondary | ICD-10-CM

## 2021-07-20 NOTE — Telephone Encounter (Signed)
°  Follow up Call-  Call back number 07/16/2021  Post procedure Call Back phone  # [phone]  Permission to leave phone message Yes  Some recent data might be hidden     Patient questions:  Do you have a fever, pain , or abdominal swelling? No. Pain Score  0 *  Have you tolerated food without any problems? Yes.    Have you been able to return to your normal activities? Yes.    Do you have any questions about your discharge instructions: Diet   No. Medications  No. Follow up visit  No.  Do you have questions or concerns about your Care? No.  Actions: * If pain score is 4 or above: No action needed, pain <4.

## 2021-07-29 ENCOUNTER — Encounter: Payer: Self-pay | Admitting: Physician Assistant

## 2021-07-30 ENCOUNTER — Other Ambulatory Visit: Payer: Self-pay

## 2021-07-30 DIAGNOSIS — G4730 Sleep apnea, unspecified: Secondary | ICD-10-CM

## 2021-08-10 ENCOUNTER — Encounter: Payer: Self-pay | Admitting: Gastroenterology

## 2021-08-21 ENCOUNTER — Other Ambulatory Visit: Payer: Self-pay | Admitting: Family Medicine

## 2021-08-21 DIAGNOSIS — Z1231 Encounter for screening mammogram for malignant neoplasm of breast: Secondary | ICD-10-CM

## 2021-08-31 ENCOUNTER — Emergency Department
Admission: EM | Admit: 2021-08-31 | Discharge: 2021-08-31 | Disposition: A | Discharge status: Home / Self Care | Attending: Emergency Medicine | Admitting: Emergency Medicine

## 2021-08-31 ENCOUNTER — Other Ambulatory Visit: Payer: Self-pay

## 2021-08-31 ENCOUNTER — Encounter: Payer: Self-pay | Admitting: Emergency Medicine

## 2021-08-31 ENCOUNTER — Ambulatory Visit: Payer: Self-pay

## 2021-08-31 DIAGNOSIS — E039 Hypothyroidism, unspecified: Secondary | ICD-10-CM | POA: Diagnosis not present

## 2021-08-31 DIAGNOSIS — H9313 Tinnitus, bilateral: Secondary | ICD-10-CM | POA: Insufficient documentation

## 2021-08-31 DIAGNOSIS — I10 Essential (primary) hypertension: Secondary | ICD-10-CM | POA: Insufficient documentation

## 2021-08-31 MED ORDER — FLUTICASONE PROPIONATE 50 MCG/ACT NA SUSP
2.0000 | Freq: Every day | NASAL | 1 refills | Status: DC
Start: 2021-08-31 — End: 2024-06-04

## 2021-08-31 NOTE — Discharge Instructions (Addendum)
Call make an appoint with Dr. Kathyrn Sheriff to see if you can be seen in the office for your tinnitus.  The MRI you had done in [place] last month was reviewed and suggested that you have some chronic sinus disease which at that time did not require any antibiotics.  Is reasonable to try a steroid nasal spray to see if this helps with your tinnitus until you can be seen by the ear nose and throat specialist.

## 2021-08-31 NOTE — ED Triage Notes (Signed)
Pt via POV from home. Pt c/o bilateral tinnitus, pt states turning her head makes it worse. Pt states hx of vertigo years go. Denies dizziness. Pt is A&Ox4 and NAD

## 2021-08-31 NOTE — ED Notes (Signed)
See triage note presents with ringing in her ears  states she is having the ringing to both ears  denies any n/v/d/  fever or dizziness

## 2021-08-31 NOTE — ED Provider Notes (Signed)
Yes  [facility] Provider Note    Event Date/Time   First MD Initiated Contact with Patient 08/31/21 1034     (approximate)   History   Tinnitus   HPI  Kayla Blake is a 56 y.o. female   presents to the ED with complaint of tinnitus for the  2 weeks.  She states for the past week it is getting slightly louder.  She denies any dizziness, headache, visual changes.  Patient states that she called her ENT in [facility] and is not able to get an appointment until March.  She denies the use of Tylenol, aspirin, or any new medications.  Patient has a history of hypertension, hypothyroidism,, vitamin D deficiency, GERD, hyperlipidemia, anxiety, alcohol abuse, and panic attacks.  She rates her discomfort as 7 out of 10.      Physical Exam   Triage Vital Signs: ED Triage Vitals [08/31/21 1007]  Enc Vitals Group     BP (!) 136/92     Pulse Rate 65     Resp 20     Temp 98.2 F (36.8 C)     Temp Source Oral     SpO2 100 %     Weight 187 lb (84.8 kg)     Height 5\' 4"  (1.626 m)     Head Circumference      Peak Flow      Pain Score 7     Pain Loc      Pain Edu?      Excl. in [place]?     Most recent vital signs: Vitals:   08/31/21 1007  BP: (!) 136/92  Pulse: 65  Resp: 20  Temp: 98.2 F (36.8 C)  SpO2: 100%     General: Awake, no distress.  Alert, talkative. CV:  Good peripheral perfusion.  Heart regular rate and rhythm without murmur. Resp:  Normal effort.  Lungs are clear bilaterally. Abd:  No distention.  Other:  EACs are clear bilaterally.  TMs are dull but no effusion, erythema or injection is noted.   ED Results / Procedures / Treatments   Labs (all labs ordered are listed, but only abnormal results are displayed) Labs Reviewed - No data to display    PROCEDURES:  Critical Care performed:   Procedures   MEDICATIONS ORDERED IN ED: Medications - No data to display   IMPRESSION / MDM / [facility]va / ED COURSE  I  reviewed the triage vital signs and the nursing notes.   Differential diagnosis includes, but is not limited to, tinnitus, otitis media, upper respiratory infection.  56 year old female presents to the ED with complaint of bilateral tinnitus for approximately 2 weeks.  Patient denies any changes in medication, vertigo, nausea, headache or visual changes.  Physical exam was benign.  Patient is disgruntled and that she is unable to see her ENT specialist in [place] until March.  I reviewed an MRI of her brain that was done in [facility] December 2022 which suggested some's chronic sinus disease with a retention cyst.  She is agreeable to try Flonase nasal spray for the next 30 days until she can see her ENT or she was given the name of the specialist on call in [place] to see if she could get an appointment sooner.  She was given more information about tinnitus.       FINAL CLINICAL IMPRESSION(S) / ED DIAGNOSES   Final diagnoses:  Tinnitus of both ears     Rx /  DC Orders   ED Discharge Orders          Ordered    fluticasone (FLONASE) 50 MCG/ACT nasal spray  Daily        08/31/21 1141             Note:  This document was prepared using Dragon voice recognition software and may include unintentional dictation errors.   Johnn Hai, PA-C 08/31/21 1427    Rada Hay, MD 08/31/21 [phone]

## 2021-09-03 ENCOUNTER — Encounter: Payer: Self-pay | Admitting: Pulmonary Disease

## 2021-09-03 ENCOUNTER — Ambulatory Visit (INDEPENDENT_AMBULATORY_CARE_PROVIDER_SITE_OTHER): Admitting: Pulmonary Disease

## 2021-09-03 ENCOUNTER — Other Ambulatory Visit: Payer: Self-pay

## 2021-09-03 VITALS — BP 120/70 | HR 70 | Temp 98.0°F | Ht 64.0 in | Wt 189.0 lb

## 2021-09-03 DIAGNOSIS — R0683 Snoring: Secondary | ICD-10-CM

## 2021-09-03 NOTE — Patient Instructions (Signed)
Moderate probability of significant obstructive sleep apnea  We will schedule you for home sleep study Update you with results as soon as reviewed  I will see you back in about 3 to 4 months  Continue working on weight loss efforts  Sleep Apnea Sleep apnea affects breathing during sleep. It causes breathing to stop for 10 seconds or more, or to become shallow. People with sleep apnea usually snore loudly. It can also increase the risk of: Heart attack. Stroke. Being very overweight (obese). Diabetes. Heart failure. Irregular heartbeat. High blood pressure. The goal of treatment is to help you breathe normally again. What are the causes? The most common cause of this condition is a collapsed or blocked airway. There are three kinds of sleep apnea: Obstructive sleep apnea. This is caused by a blocked or collapsed airway. Central sleep apnea. This happens when the brain does not send the right signals to the muscles that control breathing. Mixed sleep apnea. This is a combination of obstructive and central sleep apnea. What increases the risk? Being overweight. Smoking. Having a small airway. Being older. Being female. Drinking alcohol. Taking medicines to calm yourself (sedatives or tranquilizers). Having family members with the condition. Having a tongue or tonsils that are larger than normal. What are the signs or symptoms? Trouble staying asleep. Loud snoring. Headaches in the morning. Waking up gasping. Dry mouth or sore throat in the morning. Being sleepy or tired during the day. If you are sleepy or tired during the day, you may also: Not be able to focus your mind (concentrate). Forget things. Get angry a lot and have mood swings. Feel sad (depressed). Have changes in your personality. Have less interest in sex, if you are female. Be unable to have an erection, if you are female. How is this treated?  Sleeping on your side. Using a medicine to get rid of mucus  in your nose (decongestant). Avoiding the use of alcohol, medicines to help you relax, or certain pain medicines (narcotics). Losing weight, if needed. Changing your diet. Quitting smoking. Using a machine to open your airway while you sleep, such as: An oral appliance. This is a mouthpiece that shifts your lower jaw forward. A CPAP device. This device blows air through a mask when you breathe out (exhale). An EPAP device. This has valves that you put in each nostril. A BIPAP device. This device blows air through a mask when you breathe in (inhale) and breathe out. Having surgery if other treatments do not work. Follow these instructions at home: Lifestyle Make changes that your doctor recommends. Eat a healthy diet. Lose weight if needed. Avoid alcohol, medicines to help you relax, and some pain medicines. Do not smoke or use any products that contain nicotine or tobacco. If you need help quitting, ask your doctor. General instructions Take over-the-counter and prescription medicines only as told by your doctor. If you were given a machine to use while you sleep, use it only as told by your doctor. If you are having surgery, make sure to tell your doctor you have sleep apnea. You may need to bring your device with you. Keep all follow-up visits. Contact a doctor if: The machine that you were given to use during sleep bothers you or does not seem to be working. You do not get better. You get worse. Get help right away if: Your chest hurts. You have trouble breathing in enough air. You have an uncomfortable feeling in your back, arms, or stomach. You have  trouble talking. One side of your body feels weak. A part of your face is hanging down. These symptoms may be an emergency. Get help right away. Call your local emergency services (911 in the [place] Do not wait to see if the symptoms will go away. Do not drive yourself to the hospital. Summary This condition affects breathing  during sleep. The most common cause is a collapsed or blocked airway. The goal of treatment is to help you breathe normally while you sleep. This information is not intended to replace advice given to you by your health care provider. Make sure you discuss any questions you have with your health care provider. Document Revised: 02/25/2021 Document Reviewed: 06/27/2020 Elsevier Patient Education  2022 [employer].

## 2021-09-03 NOTE — Progress Notes (Signed)
Kayla Blake    [id number]    [birth year]  Primary Care Physician:Wolters, Ivin Booty, MD  Referring Physician: Jonathon Jordan, MD [place],  [place] [address]  Chief complaint:  History of snoring, nonrestorative sleep Multiple awakenings, tossing and turning in sleep  HPI:  Longstanding history of snoring, sometimes loud Has not been told about apneas Tosses and turns a lot during sleep  Admits to dryness of the mouth in the morning, occasional headaches Memory is poor  History of hypertension, hypercholesterolemia  Usually tries to go to bed between 10 and 11, takes albuterol 45 minutes an hour to fall asleep About 3-4 awakenings Final wake up time between 9 and 10 with an alarm clock  Has gained about 20 pounds recently   Significant family history of snoring  Occasional shortness of breath with activity, history of anxiety Outpatient Encounter Medications as of 09/03/2021  Medication Sig   amLODipine (NORVASC) 10 MG tablet Take 10 mg by mouth daily.   clobetasol cream (TEMOVATE) 6.14 % Apply 1 application topically as needed.   fluticasone (FLONASE) 50 MCG/ACT nasal spray Place 2 sprays into both nostrils daily.   hydrochlorothiazide (HYDRODIURIL) 25 MG tablet Take 25 mg by mouth daily.   Multiple Vitamins-Minerals (MULTIVITAMINS THER. W/MINERALS) TABS tablet Take 1 tablet by mouth daily.   pantoprazole (PROTONIX) 40 MG tablet Take 1 tablet (40 mg total) by mouth daily.   PARoxetine (PAXIL) 40 MG tablet Take 40 mg by mouth daily.   traZODone (DESYREL) 50 MG tablet Take 50 mg by mouth at bedtime as needed for sleep.   potassium chloride SA (KLOR-CON M) 20 MEQ tablet Take 1 tablet (20 mEq total) by mouth 2 (two) times daily for 5 days.   No facility-administered encounter medications on file as of 09/03/2021.    Allergies as of 09/03/2021   (No Known Allergies)    Past Medical History:  Diagnosis Date   Alcohol abuse     Allergic rhinitis    Anxiety    Arthritis    GERD (gastroesophageal reflux disease)    Heart murmur    Hyperlipidemia    Hypertension    Hypothyroidism    IBS (irritable bowel syndrome)    Macrocytosis    Uterine fibroids affecting pregnancy    Vitamin D deficiency     Past Surgical History:  Procedure Laterality Date   BREAST EXCISIONAL BIOPSY     CHOLECYSTECTOMY     COLONOSCOPY     ORIF ANKLE FRACTURE Right 05/15/2015   Procedure: OPEN REDUCTION INTERNAL FIXATION (ORIF) ANKLE FRACTURE;  Surgeon: Newt Minion, MD;  Location: [facility];  Service: Orthopedics;  Laterality: Right;   RADIOACTIVE SEED GUIDED EXCISIONAL BREAST BIOPSY Right 07/16/2015   Procedure: RADIOACTIVE SEED GUIDED EXCISIONAL BREAST BIOPSY;  Surgeon: Rolm Bookbinder, MD;  Location: [facility];  Service: General;  Laterality: Right;   TUBAL LIGATION      Family History  Problem Relation Age of Onset   Diabetes Mother    Hypertension Mother    Breast cancer Sister    Colon polyps Sister    Leukemia Brother    Diabetes Maternal Grandmother    Hypertension Maternal Grandmother    Colon cancer Neg Hx    Esophageal cancer Neg Hx    Rectal cancer Neg Hx    Stomach cancer Neg Hx     Social History   Socioeconomic History   Marital status: Married  Spouse name: Not on file   Number of children: 2   Years of education: Not on file   Highest education level: Not on file  Occupational History   Occupation: retired  Tobacco Use   Smoking status: Never   Smokeless tobacco: Never  Vaping Use   Vaping Use: Never used  Substance and Sexual Activity   Alcohol use: Yes    Alcohol/week: 18.0 standard drinks    Types: 14 Cans of beer, 4 Standard drinks or equivalent per week    Comment: wine every other day   Drug use: No   Sexual activity: Not on file  Other Topics Concern   Not on file  Social History Narrative   Caffeine use on occasions   Right Handed    Lives in a two story home     Social Determinants of Health   Financial Resource Strain: Not on file  Food Insecurity: Not on file  Transportation Needs: Not on file  Physical Activity: Not on file  Stress: Not on file  Social Connections: Not on file  Intimate Partner Violence: Not on file    Review of Systems  Constitutional:  Positive for fatigue.  Psychiatric/Behavioral:  Positive for sleep disturbance.    Vitals:   09/03/21 1051  BP: 120/70  Pulse: 70  Temp: 98 F (36.7 C)  SpO2: 100%     Physical Exam Constitutional:      Appearance: She is obese.  HENT:     Head: Normocephalic.     Nose: Nose normal.     Mouth/Throat:     Mouth: Mucous membranes are moist.     Comments: Crowded oropharynx, Mallampati 4 Eyes:     Pupils: Pupils are equal, round, and reactive to light.  Cardiovascular:     Rate and Rhythm: Normal rate and regular rhythm.     Heart sounds: No murmur heard.   No friction rub.  Pulmonary:     Effort: No respiratory distress.     Breath sounds: No stridor. No wheezing or rhonchi.  Musculoskeletal:     Cervical back: No rigidity or tenderness.  Neurological:     Mental Status: She is alert.  Psychiatric:        Mood and Affect: Mood normal.    Results of the Epworth flowsheet 09/03/2021  Sitting and reading 3  Watching TV 1  Sitting, inactive in a public place (e.g. a theatre or a meeting) 0  As a passenger in a car for an hour without a break 0  Lying down to rest in the afternoon when circumstances permit 2  Sitting and talking to someone 0  Sitting quietly after a lunch without alcohol 2  In a car, while stopped for a few minutes in traffic 0  Total score 8    Data Reviewed: No previous sleep study available to review  Assessment:  Excessive daytime sleepiness -Likely related to presence of obstructive sleep apnea that is all been treated currently  Moderate probability of significant obstructive sleep apnea -Contributing to excessive daytime  sleepiness -Likely contributing to nonrestorative sleep  Obesity -she is working on weight loss efforts  Pathophysiology of sleep disordered breathing discussed with the patient Treatment options discussed with the patient   Plan/Recommendations: Schedule patient for a home sleep study  Encouraged to continue working on weight loss efforts  Tentative follow-up in 3 to 4 months  Risks of not treating sleep disordered breathing discussed with the patient  Encouraged to give [place]  a call with any significant concerns   Sherrilyn Rist MD [facility] 09/03/2021, 10:55 AM  CC: Jonathon Jordan, MD

## 2021-09-23 ENCOUNTER — Ambulatory Visit

## 2021-09-30 ENCOUNTER — Ambulatory Visit
Admission: RE | Admit: 2021-09-30 | Discharge: 2021-09-30 | Disposition: A | Discharge status: Home / Self Care | Source: Ambulatory Visit | Attending: Family Medicine | Admitting: Family Medicine

## 2021-09-30 DIAGNOSIS — Z1231 Encounter for screening mammogram for malignant neoplasm of breast: Secondary | ICD-10-CM

## 2021-10-20 ENCOUNTER — Ambulatory Visit

## 2021-10-20 ENCOUNTER — Other Ambulatory Visit: Payer: Self-pay

## 2021-10-20 DIAGNOSIS — R0683 Snoring: Secondary | ICD-10-CM

## 2021-10-28 ENCOUNTER — Telehealth: Payer: Self-pay | Admitting: Pulmonary Disease

## 2021-10-28 DIAGNOSIS — G4734 Idiopathic sleep related nonobstructive alveolar hypoventilation: Secondary | ICD-10-CM

## 2021-10-28 DIAGNOSIS — R0683 Snoring: Secondary | ICD-10-CM | POA: Diagnosis not present

## 2021-10-28 NOTE — Telephone Encounter (Signed)
Call patient ? ?Sleep study result ? ?Date of study: ?10/20/2021 ? ?Impression: ?Moderately severe oxygen desaturations ?Negative study for significant sleep disordered breathing ? ?Recommendation: ?Will require further evaluation for conditions that may contribute to oxygen desaturations-rule out underlying lung disease ? ?Confirm significant desaturations with an overnight oximetry and qualify for oxygen supplementation at night ? ?If persistent of significant daytime sleepiness, an in lab study may be requested to confirm absence of significant sleep disordered breathing ? ?Schedule for follow-up to further discuss findings on sleep study and need for oxygen supplementation ? ?-If patient agreeable, order overnight oximetry ?-This is to confirm desaturations and qualify for oxygen supplementation ?

## 2021-10-29 NOTE — Telephone Encounter (Signed)
I called and spoke with patient and she is agreeable to overnight oximetry. I have placed the order. Nothing further needed,  ?

## 2021-11-01 ENCOUNTER — Other Ambulatory Visit: Payer: Self-pay | Admitting: Physician Assistant

## 2021-11-16 ENCOUNTER — Telehealth: Payer: Self-pay | Admitting: Pulmonary Disease

## 2021-11-16 ENCOUNTER — Encounter: Payer: Self-pay | Admitting: Pulmonary Disease

## 2021-12-24 ENCOUNTER — Encounter

## 2021-12-24 ENCOUNTER — Encounter: Admitting: Psychology

## 2021-12-31 ENCOUNTER — Encounter: Admitting: Psychology

## 2022-05-04 ENCOUNTER — Other Ambulatory Visit: Payer: Self-pay | Admitting: Physician Assistant

## 2022-09-01 ENCOUNTER — Other Ambulatory Visit: Payer: Self-pay | Admitting: Family Medicine

## 2022-09-01 DIAGNOSIS — Z1231 Encounter for screening mammogram for malignant neoplasm of breast: Secondary | ICD-10-CM

## 2022-10-07 ENCOUNTER — Ambulatory Visit
Admission: RE | Admit: 2022-10-07 | Discharge: 2022-10-07 | Disposition: A | Discharge status: Home / Self Care | Source: Ambulatory Visit | Attending: Family Medicine | Admitting: Family Medicine

## 2022-10-07 DIAGNOSIS — Z1231 Encounter for screening mammogram for malignant neoplasm of breast: Secondary | ICD-10-CM

## 2023-04-24 ENCOUNTER — Emergency Department
Admission: EM | Admit: 2023-04-24 | Discharge: 2023-04-24 | Disposition: A | Discharge status: Home / Self Care | Attending: Emergency Medicine | Admitting: Emergency Medicine

## 2023-04-24 ENCOUNTER — Other Ambulatory Visit: Payer: Self-pay

## 2023-04-24 ENCOUNTER — Emergency Department

## 2023-04-24 DIAGNOSIS — Y92410 Unspecified street and highway as the place of occurrence of the external cause: Secondary | ICD-10-CM | POA: Insufficient documentation

## 2023-04-24 DIAGNOSIS — S29019A Strain of muscle and tendon of unspecified wall of thorax, initial encounter: Secondary | ICD-10-CM | POA: Diagnosis not present

## 2023-04-24 DIAGNOSIS — M545 Low back pain, unspecified: Secondary | ICD-10-CM | POA: Diagnosis present

## 2023-04-24 MED ORDER — NAPROXEN 500 MG PO TABS
500.0000 mg | ORAL_TABLET | Freq: Once | ORAL | Status: AC
  Administered 2023-04-24: 500 mg via ORAL
  Filled 2023-04-24: qty 1

## 2023-04-24 MED ORDER — BACLOFEN 10 MG PO TABS: 10.0000 mg | ORAL_TABLET | Freq: Three times a day (TID) | ORAL | 0 refills | Status: AC
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
## 2023-06-08 ENCOUNTER — Encounter: Payer: Self-pay | Admitting: Pulmonary Disease

## 2023-06-08 ENCOUNTER — Ambulatory Visit (INDEPENDENT_AMBULATORY_CARE_PROVIDER_SITE_OTHER): Admitting: Pulmonary Disease

## 2023-06-08 VITALS — BP 138/85 | HR 64 | Ht 64.0 in | Wt 181.6 lb

## 2023-06-08 DIAGNOSIS — R0683 Snoring: Secondary | ICD-10-CM

## 2023-06-08 DIAGNOSIS — R0602 Shortness of breath: Secondary | ICD-10-CM | POA: Diagnosis not present

## 2023-06-08 NOTE — Patient Instructions (Signed)
Obtain chest x-ray today  Schedule home sleep study  Echocardiogram  Pulmonary function test  Follow-up in about 8 weeks  Continue regular exercise  Call us with significant concerns

## 2023-06-08 NOTE — Progress Notes (Signed)
Kayla Blake    [id number]    1966-01-28  Primary Care Physician:Wolters, Jasmine December, MD  Referring Physician: Mila Palmer, MD [address] [place],  [place] [address]  Chief complaint:  Patient in today with concern with a heart rate in the 40s from oura ring monitoring at night  HPI:  History of snoring, no witnessed apneas tosses and turns a lot at night  Had a sleep study a year ago that was negative for significant sleep apnea  Still does have snoring, no witnessed apneas  Concerned about her heart rate being in the 40s at night  Exercises regularly, walks about 3 miles about 3 days a week   Longstanding history of snoring, sometimes loud Has not been told about apneas Tosses and turns a lot during sleep  Admits to dryness of the mouth in the morning, occasional headaches Memory is poor  History of hypertension, hypercholesterolemia  Usually tries to go to bed between 10 and 11, takes albuterol 45 minutes an hour to fall asleep About 3-4 awakenings Final wake up time between 9 and 10 with an alarm clock  Has gained about 20 pounds recently   Significant family history of snoring  Occasional shortness of breath with activity, history of anxiety Outpatient Encounter Medications as of 06/08/2023  Medication Sig   amLODipine (NORVASC) 10 MG tablet Take 10 mg by mouth daily.   clobetasol cream (TEMOVATE) 0.05 % Apply 1 application topically as needed.   hydrochlorothiazide (HYDRODIURIL) 25 MG tablet Take 25 mg by mouth daily.   Multiple Vitamins-Minerals (MULTIVITAMINS THER. W/MINERALS) TABS tablet Take 1 tablet by mouth daily.   pantoprazole (PROTONIX) 40 MG tablet Take 1 tablet by mouth once daily   PARoxetine (PAXIL) 40 MG tablet Take 40 mg by mouth daily.   traZODone (DESYREL) 50 MG tablet Take 50 mg by mouth at bedtime as needed for sleep.   fluticasone (FLONASE) 50 MCG/ACT nasal spray Place 2 sprays into both nostrils daily.    potassium chloride SA (KLOR-CON M) 20 MEQ tablet Take 1 tablet (20 mEq total) by mouth 2 (two) times daily for 5 days.   No facility-administered encounter medications on file as of 06/08/2023.    Allergies as of 06/08/2023   (No Known Allergies)    Past Medical History:  Diagnosis Date   Alcohol abuse    Allergic rhinitis    Anxiety    Arthritis    GERD (gastroesophageal reflux disease)    Heart murmur    Hyperlipidemia    Hypertension    Hypothyroidism    IBS (irritable bowel syndrome)    Macrocytosis    Uterine fibroids affecting pregnancy    Vitamin D deficiency     Past Surgical History:  Procedure Laterality Date   BREAST EXCISIONAL BIOPSY     CHOLECYSTECTOMY     COLONOSCOPY     ORIF ANKLE FRACTURE Right 05/15/2015   Procedure: OPEN REDUCTION INTERNAL FIXATION (ORIF) ANKLE FRACTURE;  Surgeon: Nadara Mustard, MD;  Location[facility];  Service: Orthopedics;  Laterality: Right;   RADIOACTIVE SEED GUIDED EXCISIONAL BREAST BIOPSY Right 07/16/2015   Procedure: RADIOACTIVE SEED GUIDED EXCISIONAL BREAST BIOPSY;  Surgeon: Emelia Loron, MD;  Location: [facility];  Service: General;  Laterality: Right;   TUBAL LIGATION      Family History  Problem Relation Age of Onset   Diabetes Mother    Hypertension Mother    Breast cancer Sister  Colon polyps Sister    Leukemia Brother    Diabetes Maternal Grandmother    Hypertension Maternal Grandmother    Colon cancer Neg Hx    Esophageal cancer Neg Hx    Rectal cancer Neg Hx    Stomach cancer Neg Hx     Social History   Socioeconomic History   Marital status: Married    Spouse name: Not on file   Number of children: 2   Years of education: Not on file   Highest education level: Not on file  Occupational History   Occupation: retired  Tobacco Use   Smoking status: Never   Smokeless tobacco: Never  Vaping Use   Vaping status: Never Used  Substance and Sexual Activity   Alcohol use: Yes     Alcohol/week: 18.0 standard drinks of alcohol    Types: 14 Cans of beer, 4 Standard drinks or equivalent per week    Comment: wine every other day   Drug use: No   Sexual activity: Not on file  Other Topics Concern   Not on file  Social History Narrative   Caffeine use on occasions   Right Handed    Lives in a two story home    Social Determinants of Health   Financial Resource Strain: Not on file  Food Insecurity: Not on file  Transportation Needs: Not on file  Physical Activity: Not on file  Stress: Not on file  Social Connections: Not on file  Intimate Partner Violence: Not on file    Review of Systems  Constitutional:  Positive for fatigue.  Psychiatric/Behavioral:  Positive for sleep disturbance.     Vitals:   06/08/23 1355  BP: 138/85  Pulse: 64  SpO2: 98%     Physical Exam Constitutional:      Appearance: She is obese.  HENT:     Head: Normocephalic.     Nose: Nose normal.     Mouth/Throat:     Mouth: Mucous membranes are moist.     Comments: Crowded oropharynx, Mallampati 4 Eyes:     Pupils: Pupils are equal, round, and reactive to light.  Cardiovascular:     Rate and Rhythm: Normal rate and regular rhythm.     Heart sounds: No murmur heard.    No friction rub.  Pulmonary:     Effort: No respiratory distress.     Breath sounds: No stridor. No wheezing or rhonchi.  Musculoskeletal:     Cervical back: No rigidity or tenderness.  Neurological:     Mental Status: She is alert.  Psychiatric:        Mood and Affect: Mood normal.        09/03/2021   10:00 AM  Results of the Epworth flowsheet  Sitting and reading 3  Watching TV 1  Sitting, inactive in a public place (e.g. a theatre or a meeting) 0  As a passenger in a car for an hour without a break 0  Lying down to rest in the afternoon when circumstances permit 2  Sitting and talking to someone 0  Sitting quietly after a lunch without alcohol 2  In a car, while stopped for a few minutes in  traffic 0  Total score 8    Data Reviewed: No previous sleep study available to review  Assessment:  Excessive daytime sleepiness -Likely related to presence of obstructive sleep apnea that is all been treated currently  Moderate probability of significant obstructive sleep apnea -Contributing to excessive daytime sleepiness -Likely  contributing to nonrestorative sleep  Obesity -she is working on weight loss efforts  Pathophysiology of sleep disordered breathing discussed with the patient Treatment options discussed with the patient   Plan/Recommendations: Schedule patient for a home sleep study  Encouraged to continue working on weight loss efforts  Tentative follow-up in 3 to 4 months  Risks of not treating sleep disordered breathing discussed with the patient  Encouraged to give [place] a call with any significant concerns   Virl Diamond MD [facility] 06/08/2023, 2:15 PM  CC: Mila Palmer, MD

## 2023-06-10 ENCOUNTER — Ambulatory Visit (HOSPITAL_COMMUNITY)
Admission: RE | Admit: 2023-06-10 | Discharge: 2023-06-10 | Disposition: A | Discharge status: Home / Self Care | Source: Ambulatory Visit | Attending: Pulmonary Disease

## 2023-06-10 DIAGNOSIS — E785 Hyperlipidemia, unspecified: Secondary | ICD-10-CM | POA: Insufficient documentation

## 2023-06-10 DIAGNOSIS — R0602 Shortness of breath: Secondary | ICD-10-CM | POA: Diagnosis present

## 2023-06-10 DIAGNOSIS — I10 Essential (primary) hypertension: Secondary | ICD-10-CM | POA: Insufficient documentation

## 2023-06-10 LAB — ECHOCARDIOGRAM COMPLETE
Area-P 1/2: 2.87 cm2
S' Lateral: 2.7 cm

## 2023-06-16 ENCOUNTER — Encounter

## 2023-06-16 DIAGNOSIS — R0602 Shortness of breath: Secondary | ICD-10-CM

## 2023-06-16 DIAGNOSIS — R0683 Snoring: Secondary | ICD-10-CM

## 2023-07-09 ENCOUNTER — Telehealth: Payer: Self-pay | Admitting: Pulmonary Disease

## 2023-07-09 DIAGNOSIS — G4733 Obstructive sleep apnea (adult) (pediatric): Secondary | ICD-10-CM

## 2023-07-09 NOTE — Telephone Encounter (Signed)
Call patient  Sleep study result  Date of study: 06/16/2023  Impression: Mild obstructive sleep apnea with mild oxygen desaturations  Recommendation: Options of treatment for mild obstructive sleep apnea will include  1.  CPAP therapy if there is significant daytime sleepiness or other comorbidities including history of CVA or cardiac disease -If CPAP is chosen as an option of treatment auto titrating CPAP with a pressure setting of 5-15 will be appropriate  2.  Watchful waiting with emphasis on weight loss measures, sleep position modification to optimize lateral sleep, elevating the head of the bed by about 30 degrees may also help.  3.  An oral device may be fashioned for the treatment of mild sleep disordered breathing, will involve referral to dentist.   Follow-up as previously scheduled

## 2023-07-19 NOTE — Telephone Encounter (Signed)
Patient would like to know the results of her sleep study. According to a health tracking ring she is having disturbances in her sleep. She also experiences shortness of breath in the daytime. No acute appointments available and patient did not want a virtual appointment. Please call and advise [phone].

## 2023-07-19 NOTE — Telephone Encounter (Signed)
Spoke with the pt and notified of results of HST  She states she prefers CPAP  I have ordered this and scheduled for for a f/u with Dr Val Eagle for after her PFT in Jan 2025  She is not having acute symptoms  Nothing further needed

## 2023-07-25 ENCOUNTER — Ambulatory Visit: Admitting: Pulmonary Disease

## 2023-08-15 ENCOUNTER — Encounter: Payer: Self-pay | Admitting: Pulmonary Disease

## 2023-08-16 ENCOUNTER — Encounter: Payer: Self-pay | Admitting: Nurse Practitioner

## 2023-08-16 ENCOUNTER — Ambulatory Visit (INDEPENDENT_AMBULATORY_CARE_PROVIDER_SITE_OTHER): Admitting: Nurse Practitioner

## 2023-08-16 ENCOUNTER — Ambulatory Visit (INDEPENDENT_AMBULATORY_CARE_PROVIDER_SITE_OTHER)

## 2023-08-16 VITALS — BP 110/70 | HR 52 | Ht 64.0 in | Wt 181.8 lb

## 2023-08-16 DIAGNOSIS — F5101 Primary insomnia: Secondary | ICD-10-CM

## 2023-08-16 DIAGNOSIS — J4530 Mild persistent asthma, uncomplicated: Secondary | ICD-10-CM

## 2023-08-16 DIAGNOSIS — R0609 Other forms of dyspnea: Secondary | ICD-10-CM

## 2023-08-16 DIAGNOSIS — J45909 Unspecified asthma, uncomplicated: Secondary | ICD-10-CM

## 2023-08-16 DIAGNOSIS — G4733 Obstructive sleep apnea (adult) (pediatric): Secondary | ICD-10-CM

## 2023-08-16 DIAGNOSIS — R5383 Other fatigue: Secondary | ICD-10-CM

## 2023-08-16 LAB — CBC WITH DIFFERENTIAL/PLATELET
Basophils Absolute: 0 10*3/uL (ref 0.0–0.1)
Basophils Relative: 0.9 % (ref 0.0–3.0)
Eosinophils Absolute: 0.2 10*3/uL (ref 0.0–0.7)
Eosinophils Relative: 4.6 % (ref 0.0–5.0)
HCT: 43.7 % (ref 36.0–46.0)
Hemoglobin: 14.4 g/dL (ref 12.0–15.0)
Lymphocytes Relative: 39.3 % (ref 12.0–46.0)
Lymphs Abs: 1.9 10*3/uL (ref 0.7–4.0)
MCHC: 33 g/dL (ref 30.0–36.0)
MCV: 97.8 fL (ref 78.0–100.0)
Monocytes Absolute: 0.3 10*3/uL (ref 0.1–1.0)
Monocytes Relative: 7.1 % (ref 3.0–12.0)
Neutro Abs: 2.3 10*3/uL (ref 1.4–7.7)
Neutrophils Relative %: 48.1 % (ref 43.0–77.0)
Platelets: 221 10*3/uL (ref 150.0–400.0)
RBC: 4.47 Mil/uL (ref 3.87–5.11)
RDW: 14.4 % (ref 11.5–15.5)
WBC: 4.8 10*3/uL (ref 4.0–10.5)

## 2023-08-16 LAB — BASIC METABOLIC PANEL
BUN: 14 mg/dL (ref 6–23)
CO2: 28 meq/L (ref 19–32)
Calcium: 9.3 mg/dL (ref 8.4–10.5)
Chloride: 105 meq/L (ref 96–112)
Creatinine, Ser: 1 mg/dL (ref 0.40–1.20)
GFR: 61.61 mL/min (ref >60.00)
Glucose, Bld: 105 mg/dL — ABNORMAL HIGH (ref 70–99)
Potassium: 3.9 meq/L (ref 3.5–5.1)
Sodium: 142 meq/L (ref 135–145)

## 2023-08-16 LAB — TSH: TSH: 2.08 u[IU]/mL (ref 0.35–5.50)

## 2023-08-16 MED ORDER — ALBUTEROL SULFATE HFA 108 (90 BASE) MCG/ACT IN AERS: 2.0000 | INHALATION_SPRAY | Freq: Four times a day (QID) | RESPIRATORY_TRACT | 2 refills | Status: AC | PRN

## 2023-08-16 MED ORDER — ESZOPICLONE 1 MG PO TABS: 1.0000 mg | ORAL_TABLET | Freq: Every evening | ORAL | 0 refills | Status: DC | PRN

## 2023-08-16 NOTE — Progress Notes (Addendum)
 1/27  @Patient  ID: Kayla Blake, female    DOB: 03/28/1966, 58 y.o.   MRN: [id number]  Chief Complaint  Patient presents with   Acute Visit    SOB for 2 yrs and has gotten worse in the last 3-4 months.    Referring provider: Verena Mems, MD  HPI: 58 year old female, never smoker followed for daytime sleepiness, snoring, shortness of breath. She is a patient of Dr. Cathye and last seen in office 06/08/2023. Past medical history significant for anxiety, asthma, HLD, IDA, panic attacks.   TEST/EVENTS:  06/10/2023 echo: EF 60-65%. Normal diastolic parameters. RV size and function nl.  06/16/2023 HST: AHI 7.6/h, SpO2 low 81%  06/08/2023: OV with Dr. Neda. Concern for HR in the 40s on oura ring monitoring at night. Hx of snoring. Tosses and turns a lot. Had a sleep study a year ago that was negative. Admits to dryness in the mouth in the morning. Occasional headaches. Memory is poor. Wakes multiple times a night. Gained about 20 lb recently. Occasional SOB with activity. Hx of asthma. Repeat HST to rule out OSA. PFT. Echocardiogram.   08/16/2023: Today - follow up Discussed the use of AI scribe software for clinical note transcription with the patient, who gave verbal consent to proceed.  History of Present Illness   The patient, with a history of mild sleep apnea, has been using a CPAP machine for about a week. She reports no noticeable improvement in her symptoms, which include shortness of breath throughout the day and frequent nighttime awakenings. The patient also mentions experiencing breathing disturbances at night, as indicated by her oura ring. Despite these ongoing issues, the patient believes she is sleeping better with the CPAP machine, although she still experiences frequent tossing and turning. She does sometimes feel like her CPAP pressures aren't high enough.   In addition to sleep apnea, the patient has a history of asthma from childhood, but has not been on any  consistent inhaler regimen. She reports a morning cough with clear phlegm but denies any sinus symptoms or wheezing. The shortness of breath is more so with strenuous activities and uphill climbing. The patient also mentions occasional palpitations in the past but these seems to have resolved in the past year. She has not had PFTs yet - scheduled later this month.   The patient has been taking trazodone for sleep, but it does not seem to be effective as she continues to wake up throughout the night. She has not tried any other sleep medications and has no history of sleepwalking. No drowsy driving.     08/10/2023-08/15/2023: CPAP 5-15 cmH2O 5/6 days; 83% >4 hr; average use 9 hr 51 min Pressure 95th 10.1 Leaks 95th 3.1 AHI 0.6  No Known Allergies  Immunization History  Administered Date(s) Administered   Influenza Split 06/11/2008, 06/05/2012, 05/14/2013, 05/02/2020   Influenza,inj,Quad PF,6+ Mos 06/21/2016, 05/31/2017, 05/25/2018, 05/11/2019, 05/06/2021   Influenza,inj,quad, With Preservative 06/04/2014, 06/13/2015   PFIZER(Purple Top)SARS-COV-2 Vaccination 10/15/2019, 11/06/2019, 05/24/2020, 11/25/2020   Td 02/17/2002   Tdap 10/23/2012   Zoster, Live 07/10/2018, 09/11/2018    Past Medical History:  Diagnosis Date   Alcohol abuse    Allergic rhinitis    Anxiety    Arthritis    GERD (gastroesophageal reflux disease)    Heart murmur    Hyperlipidemia    Hypertension    Hypothyroidism    IBS (irritable bowel syndrome)    Macrocytosis    Uterine fibroids affecting pregnancy  Vitamin D deficiency     Tobacco History: Social History   Tobacco Use  Smoking Status Never  Smokeless Tobacco Never   Counseling given: Not Answered   Outpatient Medications Prior to Visit  Medication Sig Dispense Refill   amLODipine (NORVASC) 10 MG tablet Take 10 mg by mouth daily.     clobetasol cream (TEMOVATE) 0.05 % Apply 1 application topically as needed.     hydrochlorothiazide  (HYDRODIURIL) 25 MG tablet Take 25 mg by mouth daily.     Multiple Vitamins-Minerals (MULTIVITAMINS THER. W/MINERALS) TABS tablet Take 1 tablet by mouth daily.     pantoprazole  (PROTONIX ) 40 MG tablet Take 1 tablet by mouth once daily 30 tablet 3   PARoxetine (PAXIL) 40 MG tablet Take 40 mg by mouth daily.     traZODone (DESYREL) 50 MG tablet Take 50 mg by mouth at bedtime as needed for sleep.     fluticasone  (FLONASE ) 50 MCG/ACT nasal spray Place 2 sprays into both nostrils daily. 16 g 1   potassium chloride  SA (KLOR-CON  M) 20 MEQ tablet Take 1 tablet (20 mEq total) by mouth 2 (two) times daily for 5 days. 10 tablet 0   No facility-administered medications prior to visit.     Review of Systems:   Constitutional: No weight loss or gain, night sweats, fevers, chills, or lassitude. +fatigue  HEENT: No headaches, difficulty swallowing, tooth/dental problems, or sore throat. No sneezing, itching, ear ache, nasal congestion, or post nasal drip CV:  No chest pain, orthopnea, PND, swelling in lower extremities, anasarca, dizziness, palpitations, syncope Resp: +shortness of breath with exertion; AM cough. No excess mucus or change in color of mucus. No hemoptysis. No wheezing.  No chest wall deformity GI:  No heartburn, indigestion, abdominal pain, nausea, vomiting, diarrhea, change in bowel habits, loss of appetite, bloody stools.  GU: No dysuria, change in color of urine, urgency or frequency.  No flank pain, no hematuria  Skin: No rash, lesions, ulcerations MSK:  No joint pain or swelling.  Neuro: No dizziness or lightheadedness.  Psych: No depression or anxiety. Mood stable. +sleep disturbance    Physical Exam:  BP 110/70 (BP Location: Right Arm, Patient Position: Sitting, Cuff Size: Normal)   Pulse (!) 52   Ht 5' 4 (1.626 m)   Wt 181 lb 12.8 oz (82.5 kg)   LMP 09/03/2015   SpO2 98%   BMI 31.21 kg/m   GEN: Pleasant, interactive, well-appearing; obese; in no acute distress HEENT:   Normocephalic and atraumatic. PERRLA. Sclera white. Nasal turbinates pink, moist and patent bilaterally. No rhinorrhea present. Oropharynx pink and moist, without exudate or edema. No lesions, ulcerations, or postnasal drip.  NECK:  Supple w/ fair ROM. No JVD present. Normal carotid impulses w/o bruits. Thyroid  symmetrical with no goiter or nodules palpated. No lymphadenopathy.   CV: RRR, no m/r/g, no peripheral edema. Pulses intact, +2 bilaterally. No cyanosis, pallor or clubbing. PULMONARY:  Unlabored, regular breathing. Clear bilaterally A&P w/o wheezes/rales/rhonchi. No accessory muscle use.  GI: BS present and normoactive. Soft, non-tender to palpation. No organomegaly or masses detected.  MSK: No erythema, warmth or tenderness. Cap refil <2 sec all extrem. No deformities or joint swelling noted.  Neuro: A/Ox3. No focal deficits noted.   Skin: Warm, no lesions or rashe Psych: Normal affect and behavior. Judgement and thought content appropriate.     Lab Results:  CBC    Component Value Date/Time   WBC 4.8 08/16/2023 1211   RBC 4.47 08/16/2023 1211  HGB 14.4 08/16/2023 1211   HCT 43.7 08/16/2023 1211   PLT 221.0 08/16/2023 1211   MCV 97.8 08/16/2023 1211   MCH 31.8 05/19/2020 1223   MCHC 33.0 08/16/2023 1211   RDW 14.4 08/16/2023 1211   LYMPHSABS 1.9 08/16/2023 1211   MONOABS 0.3 08/16/2023 1211   EOSABS 0.2 08/16/2023 1211   BASOSABS 0.0 08/16/2023 1211    BMET    Component Value Date/Time   NA 142 08/16/2023 1211   K 3.9 08/16/2023 1211   CL 105 08/16/2023 1211   CO2 28 08/16/2023 1211   GLUCOSE 105 (H) 08/16/2023 1211   BUN 14 08/16/2023 1211   CREATININE 1.00 08/16/2023 1211   CALCIUM 9.3 08/16/2023 1211   GFRNONAA >60 05/19/2020 1223   GFRAA >60 10/13/2015 1818    BNP No results found for: BNP   Imaging:  DG Chest 2 View Result Date: 08/17/2023 CLINICAL DATA:  Chronic dyspnea on exertion. EXAM: CHEST - 2 VIEW COMPARISON:  05/19/2020. FINDINGS: Cardiac  silhouette is normal in size and configuration. No mediastinal or hilar masses. No evidence of adenopathy. Lungs are hyperexpanded, but clear. No pleural effusion or pneumothorax. Skeletal structures are intact. IMPRESSION: No active cardiopulmonary disease. Electronically Signed   By: Alm Parkins M.D.   On: 08/17/2023 16:44    Administration History     None           No data to display          No results found for: NITRICOXIDE      Assessment & Plan:      Obstructive Sleep Apnea Mild obstructive sleep apnea diagnosed via home sleep study. Using CPAP for about a week now. Discussed that full benefits of CPAP may not been seen for a few months. Even with consistent use, she is still having difficulties with sleep maintenance and non-refreshing sleep. Discussed adding a sleep aid to improve sleep quality. Informed about risks of sleep medicines including morning grogginess, rebound insomnia, potential mood changes, and rare risk of sleepwalking. Encouraged to continue utilizing CPAP nightly. Aware of proper care/use. Safe driving practices reviewed.  - Continue CPAP therapy - Discontinue trazodone - Start Lunesta  1 mg nightly, increase to 2 mg if no improvement after 3-4 nights, maximum 3 mg - If Lunesta  is not covered by insurance, prescribe alternative such as zolpidem    Insomnia Difficulties with sleep maintenance. See above  - Sleep hygiene measures reviewed   Dyspnea on exertion  Persistent DOE throughout the day. Echocardiogram was normal. Upcoming pulmonary function testing to evaluate for underlying lung disease. Poor sleep quality may contribute to daytime fatigue perceived as shortness of breath. Discussed potential benefits of improved sleep on daytime symptoms. She does have a history of asthma. Will have her trial SABA and reassess response at follow up. CXR today. Action plan in place - Complete pulmonary function testing - Order chest x-ray - Order basic  labs including thyroid  level and CBC  Fatigue  See above  Asthma Asthma with no consistent use of inhalers. Reports morning phlegm but no wheezing or sinus symptoms. Lung exam clear. Provided albuterol  as a rescue inhaler to assess if it alleviates shortness of breath during exertion. Informed about potential side effects of albuterol  including jitteriness, palpitations/tachycardia, and rare paradoxical bronchospasm. Action plan in place  - Prescribe albuterol  inhaler, 2 puffs every 6 hours as needed for shortness of breath - Instruct to try albuterol  before next visit to assess efficacy; do not use day of  PFT  Follow-up - Follow up with pulmonary function testing results - Follow up with Dr. Neda on August 29, 2023.        Advised if symptoms do not improve or worsen, to please contact office for sooner follow up or seek emergency care.   I spent 35 minutes of dedicated to the care of this patient on the date of this encounter to include pre-visit review of records, face-to-face time with the patient discussing conditions above, post visit ordering of testing, clinical documentation with the electronic health record, making appropriate referrals as documented, and communicating necessary findings to members of the patients care team.  Comer LULLA Rouleau, NP 08/18/2023  Pt aware and understands NP's role.

## 2023-08-16 NOTE — Patient Instructions (Addendum)
 Continue to use CPAP every night, minimum of 4-6 hours a night.  Change equipment as directed. Wash your tubing with warm soap and water daily, hang to dry. Wash humidifier portion weekly. Use bottled, distilled water and change daily Be aware of reduced alertness and do not drive or operate heavy machinery if experiencing this or drowsiness.  Exercise encouraged, as tolerated. Healthy weight management discussed.  Avoid or decrease alcohol consumption and medications that make you more sleepy, if possible. Notify if persistent daytime sleepiness occurs even with consistent use of PAP therapy.  Adjust CPAP settings to 10-15 cmH2O   Start Lunesta  1 mg At bedtime as needed for sleep. Take immediately before bed. If the 1 mg doesn't help you fall and stay asleep after a few nights of use, you can take 2 mg. Let me know if this still isn't helping. Ensure you have 7-8 hours in the bed after taking. Monitor for any mood changes or changes in sleep habits. Stop and notify immediately if these occur. Do not drive or operate heavy machinery after taking. Do not take with alcohol or other sedating medications. Ensure you apply your CPAP within 5-10 minutes of taking to avoid falling asleep without it. May cause some morning grogginess or vivid dreams.   Trial Albuterol  inhaler 2 puffs every 6 hours as needed for shortness of breath or wheezing. Do not use the day of your pulmonary function testing   Labs and chest x ray today  Follow up 1/27 for PFT and then visit with Dr. Neda. If symptoms do not improve or worsen, please contact office for sooner follow up or seek emergency care.

## 2023-08-17 NOTE — Telephone Encounter (Signed)
 Patient was seen on 08/16/2023.  Nothing further needed.

## 2023-08-18 ENCOUNTER — Encounter: Payer: Self-pay | Admitting: Nurse Practitioner

## 2023-08-29 ENCOUNTER — Ambulatory Visit (INDEPENDENT_AMBULATORY_CARE_PROVIDER_SITE_OTHER): Admitting: Pulmonary Disease

## 2023-08-29 ENCOUNTER — Ambulatory Visit (HOSPITAL_BASED_OUTPATIENT_CLINIC_OR_DEPARTMENT_OTHER): Admitting: Pulmonary Disease

## 2023-08-29 ENCOUNTER — Ambulatory Visit: Admitting: Pulmonary Disease

## 2023-08-29 ENCOUNTER — Encounter: Payer: Self-pay | Admitting: Pulmonary Disease

## 2023-08-29 VITALS — BP 138/80 | HR 66 | Ht 65.0 in | Wt 183.0 lb

## 2023-08-29 DIAGNOSIS — R0602 Shortness of breath: Secondary | ICD-10-CM

## 2023-08-29 DIAGNOSIS — G4733 Obstructive sleep apnea (adult) (pediatric): Secondary | ICD-10-CM

## 2023-08-29 LAB — PULMONARY FUNCTION TEST
DL/VA % pred: 104 %
DL/VA: 4.38 ml/min/mmHg/L
DLCO cor % pred: 78 %
DLCO cor: 16.54 ml/min/mmHg
DLCO unc % pred: 81 %
DLCO unc: 17.02 ml/min/mmHg
FEF 25-75 Post: 2.72 L/s
FEF 25-75 Pre: 1.87 L/s
FEF2575-%Change-Post: 45 %
FEF2575-%Pred-Post: 110 %
FEF2575-%Pred-Pre: 76 %
FEV1-%Change-Post: 8 %
FEV1-%Pred-Post: 82 %
FEV1-%Pred-Pre: 75 %
FEV1-Post: 2.21 L
FEV1-Pre: 2.03 L
FEV1FVC-%Change-Post: 4 %
FEV1FVC-%Pred-Pre: 102 %
FEV6-%Change-Post: 3 %
FEV6-%Pred-Post: 78 %
FEV6-%Pred-Pre: 75 %
FEV6-Post: 2.62 L
FEV6-Pre: 2.53 L
FEV6FVC-%Pred-Post: 103 %
FEV6FVC-%Pred-Pre: 103 %
FVC-%Change-Post: 3 %
FVC-%Pred-Post: 75 %
FVC-%Pred-Pre: 73 %
FVC-Post: 2.62 L
FVC-Pre: 2.53 L
Post FEV1/FVC ratio: 84 %
Post FEV6/FVC ratio: 100 %
Pre FEV1/FVC ratio: 81 %
Pre FEV6/FVC Ratio: 100 %
RV % pred: 84 %
RV: 1.7 L
TLC % pred: 83 %
TLC: 4.34 L

## 2023-08-29 MED ORDER — ESZOPICLONE 3 MG PO TABS: 3.0000 mg | ORAL_TABLET | Freq: Every day | ORAL | 1 refills | Status: DC

## 2023-08-29 NOTE — Progress Notes (Signed)
Full PFT Performed Today

## 2023-08-29 NOTE — Patient Instructions (Addendum)
Prescription for Lunesta 3 mg sent to pharmacy for you  Continue using your CPAP nightly -Download from the machine shows it is working well  Follow-up in about 3 months  Call us with significant concerns

## 2023-08-29 NOTE — Progress Notes (Signed)
Kayla Blake    [id number]    [birth year]  Primary Care Physician:Wolters, Jasmine December, MD  Referring Physician: Mila Palmer, MD [address] [place],  [place] [address]  Chief complaint:  Patient in today with concern with a heart rate in the 40s from oura ring monitoring at night  HPI:  History of snoring, no witnessed apneas tosses and turns a lot at night -Had a sleep study showing mild obstructive sleep apnea -Has started to use CPAP -She feels she is benefiting from CPAP -Appears to be waking up feeling better  She does wake up a few times during the night, has been trying Lunesta 1 mg, did go up to 2 mg without significant benefit, we discussed increasing to 3 mg  Had a PFT performed showing mild restrictive disease  Exercises regularly, walks about 3 miles about 3 days a week  Longstanding history of snoring, sometimes loud Has not been told about apneas Tosses and turns a lot during sleep  Admits to dryness of the mouth in the morning, occasional headaches Memory is poor  History of hypertension, hypercholesterolemia  Usually tries to go to bed between 10 and 11, takes albuterol 45 minutes an hour to fall asleep About 3-4 awakenings Final wake up time between 9 and 10 with an alarm clock  Has gained about 20 pounds recently   Significant family history of snoring  Occasional shortness of breath with activity, history of anxiety Outpatient Encounter Medications as of 08/29/2023  Medication Sig   albuterol (VENTOLIN HFA) 108 (90 Base) MCG/ACT inhaler Inhale 2 puffs into the lungs every 6 (six) hours as needed for wheezing or shortness of breath.   amLODipine (NORVASC) 10 MG tablet Take 10 mg by mouth daily.   clobetasol cream (TEMOVATE) 0.05 % Apply 1 application topically as needed.   eszopiclone (LUNESTA) 1 MG TABS tablet Take 1 tablet (1 mg total) by mouth at bedtime as needed for sleep. Take immediately before bedtime   fluticasone  (FLONASE) 50 MCG/ACT nasal spray Place 2 sprays into both nostrils daily.   hydrochlorothiazide (HYDRODIURIL) 25 MG tablet Take 25 mg by mouth daily.   Multiple Vitamins-Minerals (MULTIVITAMINS THER. W/MINERALS) TABS tablet Take 1 tablet by mouth daily.   pantoprazole (PROTONIX) 40 MG tablet Take 1 tablet by mouth once daily   PARoxetine (PAXIL) 40 MG tablet Take 40 mg by mouth daily.   potassium chloride SA (KLOR-CON M) 20 MEQ tablet Take 1 tablet (20 mEq total) by mouth 2 (two) times daily for 5 days.   No facility-administered encounter medications on file as of 08/29/2023.    Allergies as of 08/29/2023   (No Known Allergies)    Past Medical History:  Diagnosis Date   Alcohol abuse    Allergic rhinitis    Anxiety    Arthritis    GERD (gastroesophageal reflux disease)    Heart murmur    Hyperlipidemia    Hypertension    Hypothyroidism    IBS (irritable bowel syndrome)    Macrocytosis    Uterine fibroids affecting pregnancy    Vitamin D deficiency     Past Surgical History:  Procedure Laterality Date   BREAST EXCISIONAL BIOPSY     CHOLECYSTECTOMY     COLONOSCOPY     ORIF ANKLE FRACTURE Right 05/15/2015   Procedure: OPEN REDUCTION INTERNAL FIXATION (ORIF) ANKLE FRACTURE;  Surgeon: Nadara Mustard, MD;  Location: [facility];  Service: Orthopedics;  Laterality: Right;  RADIOACTIVE SEED GUIDED EXCISIONAL BREAST BIOPSY Right 07/16/2015   Procedure: RADIOACTIVE SEED GUIDED EXCISIONAL BREAST BIOPSY;  Surgeon: Emelia Loron, MD;  Location: [facility];  Service: General;  Laterality: Right;   TUBAL LIGATION      Family History  Problem Relation Age of Onset   Diabetes Mother    Hypertension Mother    Breast cancer Sister    Colon polyps Sister    Leukemia Brother    Diabetes Maternal Grandmother    Hypertension Maternal Grandmother    Colon cancer Neg Hx    Esophageal cancer Neg Hx    Rectal cancer Neg Hx    Stomach cancer Neg Hx     Social History    Socioeconomic History   Marital status: Married    Spouse name: Not on file   Number of children: 2   Years of education: Not on file   Highest education level: Not on file  Occupational History   Occupation: retired  Tobacco Use   Smoking status: Never   Smokeless tobacco: Never  Vaping Use   Vaping status: Never Used  Substance and Sexual Activity   Alcohol use: Yes    Alcohol/week: 18.0 standard drinks of alcohol    Types: 14 Cans of beer, 4 Standard drinks or equivalent per week    Comment: wine every other day   Drug use: No   Sexual activity: Not on file  Other Topics Concern   Not on file  Social History Narrative   Caffeine use on occasions   Right Handed    Lives in a two story home    Social Drivers of Health   Financial Resource Strain: Not on file  Food Insecurity: Not on file  Transportation Needs: Not on file  Physical Activity: Not on file  Stress: Not on file  Social Connections: Not on file  Intimate Partner Violence: Not on file    Review of Systems  Constitutional:  Positive for fatigue.  Respiratory:  Positive for apnea.   Psychiatric/Behavioral:  Positive for sleep disturbance.     Vitals:   08/29/23 1504  BP: 138/80  Pulse: 66  SpO2: 99%     Physical Exam Constitutional:      Appearance: She is obese.  HENT:     Head: Normocephalic.     Nose: Nose normal.     Mouth/Throat:     Mouth: Mucous membranes are moist.     Comments: Crowded oropharynx, Mallampati 4 Eyes:     Pupils: Pupils are equal, round, and reactive to light.  Cardiovascular:     Rate and Rhythm: Normal rate and regular rhythm.     Heart sounds: No murmur heard.    No friction rub.  Pulmonary:     Effort: No respiratory distress.     Breath sounds: No stridor. No wheezing or rhonchi.  Musculoskeletal:     Cervical back: No rigidity or tenderness.  Neurological:     Mental Status: She is alert.  Psychiatric:        Mood and Affect: Mood normal.        06/08/2023    2:00 PM 09/03/2021   10:00 AM  Results of the Epworth flowsheet  Sitting and reading 2 3  Watching TV 2 1  Sitting, inactive in a public place (e.g. a theatre or a meeting) 0 0  As a passenger in a car for an hour without a break 2 0  Lying down to rest in  the afternoon when circumstances permit 2 2  Sitting and talking to someone 0 0  Sitting quietly after a lunch without alcohol 3 2  In a car, while stopped for a few minutes in traffic 0 0  Total score 11 8    Data Reviewed: No previous sleep study available to review  Sleep study significant for mild obstructive sleep apnea  CPAP compliance reviewed showing excellent compliance since he received the machine Using it nightly AutoSet 10-15 Residual AHI of 0.5  Pulmonary function test shows no significant obstruction, mild restriction, normal diffusing capacity  Echocardiogram November 2024 was within normal limits  Assessment:  Mild obstructive sleep apnea -On CPAP therapy -Benefiting from CPAP use  Compliant with CPAP use  Class I obesity -Continues to work on weight loss efforts -Exercising regularly  Sleep maintenance insomnia   Plan/Recommendations: Continue CPAP nightly  Prescription for Lunesta 3 mg sent to pharmacy  Graded activities as tolerated  Tentative follow-up in about 3 months from here to see how she is doing with the new Lunesta dose and follow-up of sleep disordered breathing   Graded activities as tolerated  Tentative follow-up in 3 to 4 months   Virl Diamond MD [facility] 08/29/2023, 3:23 PM  CC: Mila Palmer, MD

## 2023-08-29 NOTE — Patient Instructions (Signed)
Full PFT Performed Today

## 2023-09-07 ENCOUNTER — Other Ambulatory Visit: Payer: Self-pay | Admitting: Family Medicine

## 2023-09-07 DIAGNOSIS — Z1231 Encounter for screening mammogram for malignant neoplasm of breast: Secondary | ICD-10-CM

## 2023-10-05 ENCOUNTER — Telehealth: Payer: Self-pay | Admitting: Nurse Practitioner

## 2023-10-05 NOTE — Telephone Encounter (Signed)
Rx pended and sent to provider

## 2023-10-05 NOTE — Telephone Encounter (Signed)
 Patient states needs refill for Lunesta. Pharmacy is [facility] by mail. Patient phone number is [phone].

## 2023-10-10 ENCOUNTER — Encounter: Payer: Self-pay | Admitting: Pulmonary Disease

## 2023-10-10 ENCOUNTER — Other Ambulatory Visit: Payer: Self-pay | Admitting: Pulmonary Disease

## 2023-10-10 MED ORDER — ESZOPICLONE 3 MG PO TABS: 3.0000 mg | ORAL_TABLET | Freq: Every day | ORAL | 1 refills | Status: DC

## 2023-10-11 ENCOUNTER — Ambulatory Visit
Admission: RE | Admit: 2023-10-11 | Discharge: 2023-10-11 | Disposition: A | Discharge status: Home / Self Care | Source: Ambulatory Visit | Attending: Family Medicine | Admitting: Family Medicine

## 2023-10-11 DIAGNOSIS — Z1231 Encounter for screening mammogram for malignant neoplasm of breast: Secondary | ICD-10-CM

## 2023-10-17 ENCOUNTER — Encounter (INDEPENDENT_AMBULATORY_CARE_PROVIDER_SITE_OTHER): Payer: Self-pay

## 2023-11-28 ENCOUNTER — Ambulatory Visit: Admitting: Pulmonary Disease

## 2023-11-28 ENCOUNTER — Encounter: Payer: Self-pay | Admitting: Pulmonary Disease

## 2023-11-28 VITALS — BP 130/79 | HR 63 | Ht 65.0 in | Wt 194.8 lb

## 2023-11-28 DIAGNOSIS — G4709 Other insomnia: Secondary | ICD-10-CM

## 2023-11-28 DIAGNOSIS — E66811 Obesity, class 1: Secondary | ICD-10-CM

## 2023-11-28 DIAGNOSIS — Z6832 Body mass index (BMI) 32.0-32.9, adult: Secondary | ICD-10-CM | POA: Diagnosis not present

## 2023-11-28 DIAGNOSIS — G4733 Obstructive sleep apnea (adult) (pediatric): Secondary | ICD-10-CM | POA: Diagnosis not present

## 2023-11-28 MED ORDER — ESZOPICLONE 3 MG PO TABS
3.0000 mg | ORAL_TABLET | Freq: Every day | ORAL | 3 refills | Status: DC
Start: 2023-11-28 — End: 2024-05-22

## 2023-11-28 NOTE — Progress Notes (Signed)
 Kayla Blake    [id number]    09-11-1965  Primary Care Physician:Wolters, Genevia Kern, MD  Referring Physician: Olin Bertin, MD [address] [place],  [place] [address]  Chief complaint:  Patient in for follow-up today  HPI:  History of obstructive sleep apnea for which she has been using CPAP on a nightly basis - Waking up feeling like she has had a good nights rest - Tolerating CPAP well Has no significant issues with CPAP today  She requires Lunesta  about 3 mg to sleep through the night  She still feels there is some discordance between oral ring and download from the CPAP which we reviewed together today showing excellent improvement in number of events  PFT with mild restrictive disease  Longstanding history of snoring, sometimes loud Has not been told about apneas Tosses and turns a lot during sleep  Admits to dryness of the mouth in the morning, occasional headaches Memory is poor  History of hypertension, hypercholesterolemia  Usually tries to go to bed between 10 and 11, takes albuterol  45 minutes an hour to fall asleep About 3-4 awakenings Final wake up time between 9 and 10 with an alarm clock  Has gained about 20 pounds recently   Significant family history of snoring  Occasional shortness of breath with activity, history of anxiety Outpatient Encounter Medications as of 11/28/2023  Medication Sig   ALPRAZolam (XANAX) 0.25 MG tablet Take 0.25 mg by mouth at bedtime as needed for anxiety or sleep.   amLODipine (NORVASC) 10 MG tablet Take 10 mg by mouth daily.   clobetasol cream (TEMOVATE) 0.05 % Apply 1 application topically as needed.   Eszopiclone  3 MG TABS Take 1 tablet (3 mg total) by mouth at bedtime. Take immediately before bedtime   hydrochlorothiazide (HYDRODIURIL) 25 MG tablet Take 25 mg by mouth daily.   Multiple Vitamins-Minerals (MULTIVITAMINS THER. W/MINERALS) TABS tablet Take 1 tablet by mouth daily.   pantoprazole   (PROTONIX ) 40 MG tablet Take 1 tablet by mouth once daily   PARoxetine (PAXIL) 40 MG tablet Take 40 mg by mouth daily.   albuterol  (VENTOLIN  HFA) 108 (90 Base) MCG/ACT inhaler Inhale 2 puffs into the lungs every 6 (six) hours as needed for wheezing or shortness of breath. (Patient not taking: Reported on 11/28/2023)   fluticasone  (FLONASE ) 50 MCG/ACT nasal spray Place 2 sprays into both nostrils daily.   potassium chloride  SA (KLOR-CON  M) 20 MEQ tablet Take 1 tablet (20 mEq total) by mouth 2 (two) times daily for 5 days.   traZODone (DESYREL) 50 MG tablet Take 50 mg by mouth at bedtime. (Patient not taking: Reported on 11/28/2023)   No facility-administered encounter medications on file as of 11/28/2023.    Allergies as of 11/28/2023   (No Known Allergies)    Past Medical History:  Diagnosis Date   Alcohol abuse    Allergic rhinitis    Anxiety    Arthritis    GERD (gastroesophageal reflux disease)    Heart murmur    Hyperlipidemia    Hypertension    Hypothyroidism    IBS (irritable bowel syndrome)    Macrocytosis    Uterine fibroids affecting pregnancy    Vitamin D deficiency     Past Surgical History:  Procedure Laterality Date   BREAST EXCISIONAL BIOPSY     CHOLECYSTECTOMY     COLONOSCOPY     ORIF ANKLE FRACTURE Right 05/15/2015   Procedure: OPEN REDUCTION INTERNAL FIXATION (ORIF)  ANKLE FRACTURE;  Surgeon: Timothy Ford, MD;  Location: [facility] OR;  Service: Orthopedics;  Laterality: Right;   RADIOACTIVE SEED GUIDED EXCISIONAL BREAST BIOPSY Right 07/16/2015   Procedure: RADIOACTIVE SEED GUIDED EXCISIONAL BREAST BIOPSY;  Surgeon: Enid Harry, MD;  Location: [facility];  Service: General;  Laterality: Right;   TUBAL LIGATION      Family History  Problem Relation Age of Onset   Diabetes Mother    Hypertension Mother    Breast cancer Sister    Colon polyps Sister    Leukemia Brother    Diabetes Maternal Grandmother    Hypertension Maternal Grandmother     Colon cancer Neg Hx    Esophageal cancer Neg Hx    Rectal cancer Neg Hx    Stomach cancer Neg Hx     Social History   Socioeconomic History   Marital status: Married    Spouse name: Not on file   Number of children: 2   Years of education: Not on file   Highest education level: Not on file  Occupational History   Occupation: retired  Tobacco Use   Smoking status: Never   Smokeless tobacco: Never  Vaping Use   Vaping status: Never Used  Substance and Sexual Activity   Alcohol use: Yes    Alcohol/week: 18.0 standard drinks of alcohol    Types: 14 Cans of beer, 4 Standard drinks or equivalent per week    Comment: wine every other day   Drug use: No   Sexual activity: Not on file  Other Topics Concern   Not on file  Social History Narrative   Caffeine use on occasions   Right Handed    Lives in a two story home    Social Drivers of Health   Financial Resource Strain: Not on file  Food Insecurity: Not on file  Transportation Needs: Not on file  Physical Activity: Not on file  Stress: Not on file  Social Connections: Not on file  Intimate Partner Violence: Not on file    Review of Systems  Constitutional:  Positive for fatigue.  Respiratory:  Positive for apnea.   Psychiatric/Behavioral:  Positive for sleep disturbance.     Vitals:   11/28/23 0836  BP: 130/79  Pulse: 63  SpO2: 97%     Physical Exam Constitutional:      Appearance: She is obese.  HENT:     Head: Normocephalic.     Nose: Nose normal.     Mouth/Throat:     Mouth: Mucous membranes are moist.     Comments: Crowded oropharynx, Mallampati 4 Eyes:     Pupils: Pupils are equal, round, and reactive to light.  Cardiovascular:     Rate and Rhythm: Normal rate and regular rhythm.     Heart sounds: No murmur heard.    No friction rub.  Pulmonary:     Effort: No respiratory distress.     Breath sounds: No stridor. No wheezing or rhonchi.  Musculoskeletal:     Cervical back: No rigidity or  tenderness.  Neurological:     Mental Status: She is alert.  Psychiatric:        Mood and Affect: Mood normal.        06/08/2023    2:00 PM 09/03/2021   10:00 AM  Results of the Epworth flowsheet  Sitting and reading 2 3  Watching TV 2 1  Sitting, inactive in a public place (e.g. a theatre or a meeting) 0 0  As a passenger in a car for an hour without a break 2 0  Lying down to rest in the afternoon when circumstances permit 2 2  Sitting and talking to someone 0 0  Sitting quietly after a lunch without alcohol 3 2  In a car, while stopped for a few minutes in traffic 0 0  Total score 11 8    Data Reviewed: No previous sleep study available to review  Sleep study significant for mild obstructive sleep apnea  CPAP compliance reviewed showing excellent compliance Average use of 8 hours 46 minutes AutoSet 10-15 95 percentile pressure of 10.9 Residual AHI of 0.2  Pulmonary function test shows no significant obstruction, mild restriction, normal diffusing capacity  Echocardiogram November 2024 was within normal limits  Assessment:  Mild obstructive sleep apnea - Optimally treated with CPAP therapy  Class I obesity - Encouraged to continue working on weight loss efforts - Graded activity as tolerated  Sleep maintenance insomnia - Uses Lunesta  3 mg nightly  Plan/Recommendations: Continue CPAP nightly  Continue Lunesta   Refills placed  Continue weight loss efforts  Call us  with significant concerns  Follow-up in about 6 months from here   Myer Artis MD [facility] 11/28/2023, 8:38 AM  CC: Olin Bertin, MD

## 2023-11-28 NOTE — Patient Instructions (Signed)
 Download from your machine shows that is working very well Continue using CPAP on a nightly basis  Make sure you are getting enough hours of sleep  Make sure you continue exercising on a regular basis  Call us  with significant concerns  Will send in a refill for your Lunesta 

## 2023-12-29 ENCOUNTER — Telehealth: Payer: Self-pay | Admitting: Pulmonary Disease

## 2023-12-29 NOTE — Telephone Encounter (Signed)
 Rc'd fax order for CPAP and supplies. Will fwd to Dr. Gaynell Keeler for signature.

## 2024-01-05 NOTE — Telephone Encounter (Signed)
 CMN faxed successfully and signed.

## 2024-03-07 NOTE — Telephone Encounter (Signed)
 SABRA

## 2024-03-16 ENCOUNTER — Other Ambulatory Visit: Payer: Self-pay | Admitting: Medical Genetics

## 2024-03-22 ENCOUNTER — Other Ambulatory Visit
Admission: RE | Admit: 2024-03-22 | Discharge: 2024-03-22 | Disposition: A | Discharge status: Home / Self Care | Payer: Self-pay | Source: Ambulatory Visit | Attending: Medical Genetics | Admitting: Medical Genetics

## 2024-04-01 LAB — GENECONNECT MOLECULAR SCREEN: Genetic Analysis Overall Interpretation: NEGATIVE

## 2024-04-04 ENCOUNTER — Ambulatory Visit: Attending: Nurse Practitioner | Admitting: Nurse Practitioner

## 2024-04-04 ENCOUNTER — Ambulatory Visit (INDEPENDENT_AMBULATORY_CARE_PROVIDER_SITE_OTHER)

## 2024-04-04 ENCOUNTER — Encounter (HOSPITAL_COMMUNITY): Payer: Self-pay | Admitting: Family Medicine

## 2024-04-04 VITALS — BP 130/78 | HR 63 | Ht 65.0 in | Wt 199.2 lb

## 2024-04-04 DIAGNOSIS — E782 Mixed hyperlipidemia: Secondary | ICD-10-CM | POA: Insufficient documentation

## 2024-04-04 DIAGNOSIS — R079 Chest pain, unspecified: Secondary | ICD-10-CM

## 2024-04-04 DIAGNOSIS — M7989 Other specified soft tissue disorders: Secondary | ICD-10-CM

## 2024-04-04 DIAGNOSIS — R0602 Shortness of breath: Secondary | ICD-10-CM | POA: Insufficient documentation

## 2024-04-04 DIAGNOSIS — G4733 Obstructive sleep apnea (adult) (pediatric): Secondary | ICD-10-CM | POA: Diagnosis present

## 2024-04-04 DIAGNOSIS — R002 Palpitations: Secondary | ICD-10-CM | POA: Insufficient documentation

## 2024-04-04 DIAGNOSIS — I10 Essential (primary) hypertension: Secondary | ICD-10-CM | POA: Diagnosis present

## 2024-04-04 NOTE — Patient Instructions (Signed)
 Medication Instructions:  Your physician recommends that you continue on your current medications as directed. Please refer to the Current Medication list given to you today.  *If you need a refill on your cardiac medications before your next appointment, please call your pharmacy*  Lab Work: NONE ordered at this time of appointment   Testing/Procedures: ZIO XT- Long Term Monitor Instructions  Your physician has requested you wear a ZIO patch monitor for 14 days.  This is a single patch monitor. [employer] supplies one patch monitor per enrollment. Additional stickers are not available. Please do not apply patch if you will be having a Nuclear Stress Test,  Echocardiogram, Cardiac CT, MRI, or Chest Xray during the period you would be wearing the  monitor. The patch cannot be worn during these tests. You cannot remove and re-apply the  ZIO XT patch monitor.  Your ZIO patch monitor will be mailed 3 day USPS to your address on file. It may take 3-5 days  to receive your monitor after you have been enrolled.  Once you have received your monitor, please review the enclosed instructions. Your monitor  has already been registered assigning a specific monitor serial # to you.  Billing and Patient Assistance Program Information  We have supplied [employer] with any of your insurance information on file for billing purposes. [employer] offers a sliding scale Patient Assistance Program for patients that do not have  insurance, or whose insurance does not completely cover the cost of the ZIO monitor.  You must apply for the Patient Assistance Program to qualify for this discounted rate.  To apply, please call [employer] at [phone], select option 4, select option 2, ask to apply for  Patient Assistance Program. Meredeth will ask your household income, and how many people  are in your household. They will quote your out-of-pocket cost based on that information.  [employer] will also be able to set up a  32-month, interest-free payment plan if needed.  Applying the monitor   Shave hair from upper left chest.  Hold abrader disc by orange tab. Rub abrader in 40 strokes over the upper left chest as  indicated in your monitor instructions.  Clean area with 4 enclosed alcohol pads. Let dry.  Apply patch as indicated in monitor instructions. Patch will be placed under collarbone on left  side of chest with arrow pointing upward.  Rub patch adhesive wings for 2 minutes. Remove white label marked 1. Remove the white  label marked 2. Rub patch adhesive wings for 2 additional minutes.  While looking in a mirror, press and release button in center of patch. A small green light will  flash 3-4 times. This will be your only indicator that the monitor has been turned on.  Do not shower for the first 24 hours. You may shower after the first 24 hours.  Press the button if you feel a symptom. You will hear a small click. Record Date, Time and  Symptom in the Patient Logbook.  When you are ready to remove the patch, follow instructions on the last 2 pages of Patient  Logbook. Stick patch monitor onto the last page of Patient Logbook.  Place Patient Logbook in the blue and white box. Use locking tab on box and tape box closed  securely. The blue and white box has prepaid postage on it. Please place it in the mailbox as  soon as possible. Your physician should have your test results approximately 7 days after the  monitor has been  mailed back to [facility].  Call [facility] Customer Care at [phone] if you have questions regarding  your ZIO XT patch monitor. Call them immediately if you see an orange light blinking on your  monitor.  If your monitor falls off in less than 4 days, contact our Monitor department at [phone].  If your monitor becomes loose or falls off after 4 days call [employer] at [phone] for  suggestions on securing your monitor   Follow-Up: At [facility], you and your health needs are our priority.  As part of our continuing mission to provide you with exceptional heart care, our providers are all part of one team.  This team includes your primary Cardiologist (physician) and Advanced Practice Providers or APPs (Physician Assistants and Nurse Practitioners) who all work together to provide you with the care you need, when you need it.  Your next appointment:   2 month(s)  Provider:    Dr. Sheena or Damien Braver NP  We recommend signing up for the patient portal called MyChart.  Sign up information is provided on this After Visit Summary.  MyChart is used to connect with patients for Virtual Visits (Telemedicine).  Patients are able to view lab/test results, encounter notes, upcoming appointments, etc.  Non-urgent messages can be sent to your provider as well.   To learn more about what you can do with MyChart, go to [email or web address].

## 2024-04-04 NOTE — Progress Notes (Unsigned)
Enrolled patient for a 14 day Zio XT monitor to be mailed to patients home  DOD to read

## 2024-04-04 NOTE — Progress Notes (Signed)
 Office Visit    Patient Name: Kayla Blake Date of Encounter: 04/04/2024  Primary Care Provider:  Espinoza, Alejandra, DO Primary Cardiologist:  Kardie Tobb, DO  Chief Complaint    58 year old female with a history of palpitations, hypertension, hyperlipidemia, OSA on CPAP, asthma, and anxiety who presents for [facility] new patient evaluation.  Past Medical History    Past Medical History:  Diagnosis Date   Alcohol abuse    Allergic rhinitis    Anxiety    Arthritis    GERD (gastroesophageal reflux disease)    Heart murmur    Hyperlipidemia    Hypertension    Hypothyroidism    IBS (irritable bowel syndrome)    Macrocytosis    Uterine fibroids affecting pregnancy    Vitamin D deficiency    Past Surgical History:  Procedure Laterality Date   BREAST EXCISIONAL BIOPSY     CHOLECYSTECTOMY     COLONOSCOPY     ORIF ANKLE FRACTURE Right 05/15/2015   Procedure: OPEN REDUCTION INTERNAL FIXATION (ORIF) ANKLE FRACTURE;  Surgeon: Jerona Harden GAILS, MD;  Location: [facility] OR;  Service: Orthopedics;  Laterality: Right;   RADIOACTIVE SEED GUIDED EXCISIONAL BREAST BIOPSY Right 07/16/2015   Procedure: RADIOACTIVE SEED GUIDED EXCISIONAL BREAST BIOPSY;  Surgeon: Donnice Bury, MD;  Location: [facility];  Service: General;  Laterality: Right;   TUBAL LIGATION      Allergies  No Known Allergies   Labs/Other Studies Reviewed    The following studies were reviewed today:  Cardiac Studies & Procedures   ______________________________________________________________________________________________   STRESS TESTS  ECHOCARDIOGRAM STRESS TEST 12/03/2015  Narrative [facility] [address] [place], [place] [address] [phone]  ------------------------------------------------------------------- Stress Echocardiography  Patient:    Kayla, Blake MR #:       [id number] Study Date: 12/03/2015 Gender:     F Age:        49 Height:     165.1  cm Weight:     84.5 kg BSA:        2 m^2 Pt. Status: Room:  TISA Verena Mems [id number] BARTON Verena Mems [id number] SONOGRAPHER  Sherida Lawyer, RDCS ATTENDING    Vinie Maxcy MD PERFORMING   [facility] Outpatient  cc:  -------------------------------------------------------------------  ------------------------------------------------------------------- Indications:      Syncope (R55).  ------------------------------------------------------------------- Study Conclusions  - Stress ECG conclusions: There were no stress arrhythmias or conduction abnormalities. The stress ECG was negative for ischemia. - Staged echo: There was no echocardiographic evidence for stress-induced ischemia. - Impressions: Normal stress echo with appropriate increase in EF from 55% to 70% and decrease in LV cavity size No new RWMAls at good work load and HR 101% PMHR.  Impressions:  - Normal stress echo with appropriate increase in EF from 55% to 70% and decrease in LV cavity size No new RWMAls at good work load and HR 101% PMHR.  Bruce protocol. Stress echocardiography.  Birthdate:  Patient birthdate: 04/11/1966.  Age:  Patient is 58 yr old.  Sex:  Gender: female.    BMI: 31 kg/m^2.  Patient status:  Outpatient.  Study date:  Study date: 12/03/2015. Study time: 07:27 AM.  -------------------------------------------------------------------  ------------------------------------------------------------------- Baseline ECG:  Normal.  ------------------------------------------------------------------- Stress protocol:  +---------------------+---+------------+--------+ Stage                HR BP (mmHg)   Symptoms +---------------------+---+------------+--------+ Baseline  62 135/85 (102)None     +---------------------+---+------------+--------+ Stage 1              100144/74 (97) -------- +---------------------+---+------------+--------+ Stage 2               127155/80 (105)-------- +---------------------+---+------------+--------+ Stage 3              160188/90 (123)-------- +---------------------+---+------------+--------+ Immediate post stress171-------------------- +---------------------+---+------------+--------+ Recovery; 1 min      134192/72 (112)-------- +---------------------+---+------------+--------+ Recovery; 2 min      105-------------------- +---------------------+---+------------+--------+ Recovery; 3 min      102-------------------- +---------------------+---+------------+--------+ Recovery; 5 min      85 148/79 (102)-------- +---------------------+---+------------+--------+ Recovery; 7 min      84 133/81 (98) -------- +---------------------+---+------------+--------+  ------------------------------------------------------------------- Stress results:   Maximal heart rate during stress was 171 bpm (101% of maximal predicted heart rate). The maximal predicted heart rate was 169 bpm.The target heart rate was achieved. The heart rate response to stress was normal. There was a normal resting blood pressure with an appropriate response to stress. The rate-pressure product for the peak heart rate and blood pressure was 69919 mm Hg/min.  The patient experienced no chest pain during stress.  ------------------------------------------------------------------- Stress ECG:  There were no stress arrhythmias or conduction abnormalities.  The stress ECG was negative for ischemia.  ------------------------------------------------------------------- Baseline:  Peak stress:  ------------------------------------------------------------------- Stress echo results:     Left ventricular ejection fraction was normal at rest and with stress. There was no echocardiographic evidence for stress-induced ischemia.  ------------------------------------------------------------------- Prepared and  Electronically Authenticated by  Maude Emmer, M.D. 2017-05-03T09:48:53   ECHOCARDIOGRAM  ECHOCARDIOGRAM COMPLETE 06/10/2023  Narrative ECHOCARDIOGRAM REPORT    Patient Name:   Kayla Blake Date of Exam: 06/10/2023 Medical Rec #:  [id number]       Height:       64.0 in Accession #:    [id number]      Weight:       181.6 lb Date of Birth:  Oct 16, 1965       BSA:          1.878 m Patient Age:    57 years        BP:           144/82 mmHg Patient Gender: F               HR:           50 bpm. Exam Location:  Outpatient  Procedure: 2D Echo, Cardiac Doppler and Color Doppler  Indications:    Dyspnea R06.00  History:        Patient has prior history of Echocardiogram examinations, most recent 12/03/2015. Risk Factors:Dyslipidemia and Hypertension.  Sonographer:    Tinnie Gosling RDCS Referring Phys: [id number] ADEWALE A OLALERE  IMPRESSIONS   1. Left ventricular ejection fraction, by estimation, is 60 to 65%. The left ventricle has normal function. The left ventricle has no regional wall motion abnormalities. Left ventricular diastolic parameters were normal. 2. Right ventricular systolic function is normal. The right ventricular size is normal. 3. The mitral valve is normal in structure. No evidence of mitral valve regurgitation. No evidence of mitral stenosis. 4. The aortic valve is normal in structure. Aortic valve regurgitation is not visualized. No aortic stenosis is present. 5. The inferior vena cava is normal in size with greater than 50% respiratory variability, suggesting right atrial pressure of 3 mmHg.  Conclusion(s)/Recommendation(s): Normal biventricular function without evidence of hemodynamically significant valvular heart disease.  FINDINGS Left Ventricle: Left ventricular  ejection fraction, by estimation, is 60 to 65%. The left ventricle has normal function. The left ventricle has no regional wall motion abnormalities. The left ventricular internal cavity size was normal  in size. There is no left ventricular hypertrophy. Left ventricular diastolic parameters were normal.  Right Ventricle: The right ventricular size is normal. No increase in right ventricular wall thickness. Right ventricular systolic function is normal.  Left Atrium: Left atrial size was normal in size.  Right Atrium: Right atrial size was normal in size.  Pericardium: There is no evidence of pericardial effusion.  Mitral Valve: The mitral valve is normal in structure. No evidence of mitral valve regurgitation. No evidence of mitral valve stenosis.  Tricuspid Valve: The tricuspid valve is normal in structure. Tricuspid valve regurgitation is not demonstrated. No evidence of tricuspid stenosis.  Aortic Valve: The aortic valve is normal in structure. Aortic valve regurgitation is not visualized. No aortic stenosis is present.  Pulmonic Valve: The pulmonic valve was normal in structure. Pulmonic valve regurgitation is not visualized. No evidence of pulmonic stenosis.  Aorta: The aortic root is normal in size and structure.  Venous: The inferior vena cava is normal in size with greater than 50% respiratory variability, suggesting right atrial pressure of 3 mmHg.  IAS/Shunts: No atrial level shunt detected by color flow Doppler.   LEFT VENTRICLE PLAX 2D LVIDd:         4.50 cm   Diastology LVIDs:         2.70 cm   LV e' medial:    9.14 cm/s LV PW:         0.90 cm   LV E/e' medial:  9.6 LV IVS:        0.90 cm   LV e' lateral:   12.20 cm/s LVOT diam:     1.70 cm   LV E/e' lateral: 7.2 LV SV:         66 LV SV Index:   35 LVOT Area:     2.27 cm   RIGHT VENTRICLE             IVC RV S prime:     10.00 cm/s  IVC diam: 1.70 cm TAPSE (M-mode): 2.0 cm  LEFT ATRIUM           Index        RIGHT ATRIUM          Index LA diam:      3.30 cm 1.76 cm/m   RA Area:     6.65 cm LA Vol (A2C): 16.4 ml 8.73 ml/m   RA Volume:   10.10 ml 5.38 ml/m LA Vol (A4C): 34.9 ml 18.59 ml/m AORTIC  VALVE LVOT Vmax:   117.00 cm/s LVOT Vmean:  79.500 cm/s LVOT VTI:    0.290 m  AORTA Ao Root diam: 2.50 cm Ao Asc diam:  3.30 cm  MITRAL VALVE MV Area (PHT): 2.87 cm    SHUNTS MV Decel Time: 264 msec    Systemic VTI:  0.29 m MV E velocity: 87.50 cm/s  Systemic Diam: 1.70 cm MV A velocity: 72.80 cm/s MV E/A ratio:  1.20  Oneil Parchment MD Electronically signed by Oneil Parchment MD Signature Date/Time: 06/10/2023/12:20:26 PM    Final          ______________________________________________________________________________________________     Recent Labs: 08/16/2023: BUN 14; Creatinine, Ser 1.00; Hemoglobin 14.4; Platelets 221.0; Potassium 3.9; Sodium 142; TSH 2.08  Recent Lipid Panel No results found for: CHOL, TRIG, HDL, CHOLHDL,  VLDL, LDLCALC, LDLDIRECT  History of Present Illness    58 year old female with the above past medical history including chest pain, palpitations, hypertension, hyperlipidemia, OSA on CPAP, asthma, and anxiety.  She has a history of sleep apnea, on CPAP, followed by pulmonology.  Stress echo in 2017 was normal.  Echocardiogram in 06/2023 showed EF 60 to 65%, normal LV function, no RWMA, normal RV systolic function, no significant valvular abnormalities.    She presents today for heart first clinic new patient evaluation. She reports a 3 to 42-month history of intermittent palpitations, which she describes as her heart racing, with associated shortness of breath.  Symptoms occur 2-3 times a week, at rest, and last for 5 minutes or less.  She also reports nonpitting dependent bilateral lower extremity edema.  She denies chest pain, dizziness, PND, orthopnea, weight gain.  She drinks caffeine occasionally. She drinks 3 to 4 glasses of wine every other day, approximately 10 beers per week.  She exercises 3 times a week either by walking or outside or on a treadmill for approximately 1 hour at a time.  She denies any exertional symptoms. She denies any  significant family history of heart disease.  Home Medications    Current Outpatient Medications  Medication Sig Dispense Refill   albuterol  (VENTOLIN  HFA) 108 (90 Base) MCG/ACT inhaler Inhale 2 puffs into the lungs every 6 (six) hours as needed for wheezing or shortness of breath. 8 g 2   ALPRAZolam (XANAX) 0.25 MG tablet Take 0.25 mg by mouth at bedtime as needed for anxiety or sleep.     amLODipine (NORVASC) 10 MG tablet Take 10 mg by mouth daily.     clobetasol cream (TEMOVATE) 0.05 % Apply 1 application topically as needed.     Eszopiclone  3 MG TABS Take 1 tablet (3 mg total) by mouth at bedtime. Take immediately before bedtime 30 tablet 3   fluticasone  (FLONASE ) 50 MCG/ACT nasal spray Place 2 sprays into both nostrils daily. 16 g 1   hydrochlorothiazide (HYDRODIURIL) 25 MG tablet Take 25 mg by mouth daily.     Multiple Vitamins-Minerals (MULTIVITAMINS THER. W/MINERALS) TABS tablet Take 1 tablet by mouth daily.     pantoprazole  (PROTONIX ) 40 MG tablet Take 1 tablet by mouth once daily 30 tablet 3   PARoxetine (PAXIL) 40 MG tablet Take 40 mg by mouth daily.     potassium chloride  SA (KLOR-CON  M) 20 MEQ tablet Take 1 tablet (20 mEq total) by mouth 2 (two) times daily for 5 days. 10 tablet 0   rosuvastatin (CRESTOR) 10 MG tablet Take 10 mg by mouth daily.     No current facility-administered medications for this visit.     Review of Systems    She denies chest pain, pnd, orthopnea, n, v, dizziness, syncope, weight gain, or early satiety. All other systems reviewed and are otherwise negative except as noted above.   Physical Exam    VS:  BP 130/78   Pulse 63   Ht 5' 5 (1.651 m)   Wt 199 lb 3.2 oz (90.4 kg)   LMP 09/03/2015   SpO2 97%   BMI 33.15 kg/m   GEN: Well nourished, well developed, in no acute distress. HEENT: normal. Neck: Supple, no JVD, carotid bruits, or masses. Cardiac: RRR, no murmurs, rubs, or gallops. No clubbing, cyanosis, edema.  Radials/DP/PT 2+ and equal  bilaterally.  Respiratory:  Respirations regular and unlabored, clear to auscultation bilaterally. GI: Soft, nontender, nondistended, BS + x 4. MS: no deformity  or atrophy. Skin: warm and dry, no rash. Neuro:  Strength and sensation are intact. Psych: Normal affect.  Accessory Clinical Findings    ECG personally reviewed by me today - EKG Interpretation Date/Time:  Wednesday April 04 2024 09:06:49 EDT Ventricular Rate:  63 PR Interval:  156 QRS Duration:  80 QT Interval:  438 QTC Calculation: 448 R Axis:   -18  Text Interpretation: Normal sinus rhythm Normal ECG When compared with ECG of 19-May-2020 12:07, No significant change was found Confirmed by Daneen Perkins ([id number]) on 04/04/2024 9:25:21 AM  - no acute changes.   Lab Results  Component Value Date   WBC 4.8 08/16/2023   HGB 14.4 08/16/2023   HCT 43.7 08/16/2023   MCV 97.8 08/16/2023   PLT 221.0 08/16/2023   Lab Results  Component Value Date   CREATININE 1.00 08/16/2023   BUN 14 08/16/2023   NA 142 08/16/2023   K 3.9 08/16/2023   CL 105 08/16/2023   CO2 28 08/16/2023   Lab Results  Component Value Date   ALT 27 05/19/2020   AST 28 05/19/2020   ALKPHOS 52 05/19/2020   BILITOT 1.0 05/19/2020   No results found for: CHOL, HDL, LDLCALC, LDLDIRECT, TRIG, CHOLHDL  No results found for: HGBA1C  Assessment & Plan   1. Palpitations/shortness of breath: She reports of 3 to 47-month history of intermittent palpitations, which she describes as heart racing, with associated shortness of breath. She also reports dependent nonpitting bilateral lower extremity edema. She denies any exertional dyspnea, denies chest pain, dizziness, PND, orthopnea, weight gain. Echocardiogram in 06/2023 showed EF 60 to 65%, normal LV function, no RWMA, normal RV systolic function, no significant valvular abnormalities.  Prior TSH was normal.  Given ongoing palpitations, will check 14-day ZIO monitor. She does take amlodipine 10 mg  daily, this is possibly contributing to her lower extremity edema.  She also has a history of significant anxiety, alcohol use. Reviewed triggers, ED precautions. Further recommendations pending monitor results.  2. Hypertension: BP well controlled. Continue current antihypertensive regimen.   3. Hyperlipidemia: LDL was 150 in 03/2024. Recently started on rosuvastatin.  Pending repeat labs per PCP.  Continue Crestor.  4. OSA: Adherent to CPAP.  5. Disposition:  She wishes to follow with a female doctor, will have her follow-up with Dr. Sheena. Follow-up in 2 months, sooner if needed.    Perkins JAYSON Daneen, NP 04/07/2024, 12:36 PM

## 2024-04-07 ENCOUNTER — Encounter: Payer: Self-pay | Admitting: Nurse Practitioner

## 2024-04-20 DIAGNOSIS — R002 Palpitations: Secondary | ICD-10-CM

## 2024-04-27 ENCOUNTER — Ambulatory Visit: Payer: Self-pay | Admitting: Nurse Practitioner

## 2024-05-02 ENCOUNTER — Ambulatory Visit (INDEPENDENT_AMBULATORY_CARE_PROVIDER_SITE_OTHER)

## 2024-05-02 DIAGNOSIS — R0602 Shortness of breath: Secondary | ICD-10-CM | POA: Diagnosis not present

## 2024-05-08 ENCOUNTER — Other Ambulatory Visit (HOSPITAL_COMMUNITY): Payer: Self-pay | Admitting: Family Medicine

## 2024-05-08 DIAGNOSIS — R0602 Shortness of breath: Secondary | ICD-10-CM

## 2024-05-08 DIAGNOSIS — M7989 Other specified soft tissue disorders: Secondary | ICD-10-CM

## 2024-05-09 ENCOUNTER — Ambulatory Visit (HOSPITAL_COMMUNITY)
Admission: RE | Admit: 2024-05-09 | Discharge: 2024-05-09 | Disposition: A | Discharge status: Home / Self Care | Source: Ambulatory Visit | Attending: Cardiology | Admitting: Cardiology

## 2024-05-09 ENCOUNTER — Encounter (HOSPITAL_COMMUNITY): Payer: Self-pay

## 2024-05-09 ENCOUNTER — Ambulatory Visit (HOSPITAL_COMMUNITY)

## 2024-05-09 DIAGNOSIS — M7989 Other specified soft tissue disorders: Secondary | ICD-10-CM | POA: Diagnosis present

## 2024-05-09 DIAGNOSIS — R600 Localized edema: Secondary | ICD-10-CM

## 2024-05-09 DIAGNOSIS — R0602 Shortness of breath: Secondary | ICD-10-CM | POA: Diagnosis not present

## 2024-05-09 LAB — ECHOCARDIOGRAM COMPLETE
Area-P 1/2: 3.42 cm2
S' Lateral: 2.5 cm

## 2024-05-21 ENCOUNTER — Other Ambulatory Visit: Payer: Self-pay | Admitting: Pulmonary Disease

## 2024-05-22 ENCOUNTER — Other Ambulatory Visit: Payer: Self-pay | Admitting: Pulmonary Disease

## 2024-05-22 ENCOUNTER — Telehealth: Payer: Self-pay | Admitting: Pulmonary Disease

## 2024-05-22 MED ORDER — ESZOPICLONE 3 MG PO TABS: 3.0000 mg | ORAL_TABLET | Freq: Every day | ORAL | 5 refills | Status: DC

## 2024-05-22 NOTE — Telephone Encounter (Signed)
 Please sign rx refill for Lunesta , thank you!

## 2024-06-04 ENCOUNTER — Encounter: Payer: Self-pay | Admitting: Nurse Practitioner

## 2024-06-04 ENCOUNTER — Ambulatory Visit: Attending: Nurse Practitioner | Admitting: Nurse Practitioner

## 2024-06-04 VITALS — BP 126/84 | HR 83 | Ht 66.0 in | Wt 194.6 lb

## 2024-06-04 DIAGNOSIS — E782 Mixed hyperlipidemia: Secondary | ICD-10-CM | POA: Diagnosis not present

## 2024-06-04 DIAGNOSIS — R0602 Shortness of breath: Secondary | ICD-10-CM | POA: Insufficient documentation

## 2024-06-04 DIAGNOSIS — I10 Essential (primary) hypertension: Secondary | ICD-10-CM | POA: Diagnosis not present

## 2024-06-04 DIAGNOSIS — G4733 Obstructive sleep apnea (adult) (pediatric): Secondary | ICD-10-CM | POA: Insufficient documentation

## 2024-06-04 DIAGNOSIS — R002 Palpitations: Secondary | ICD-10-CM | POA: Diagnosis not present

## 2024-06-04 NOTE — Patient Instructions (Signed)
 Medication Instructions:  Your physician recommends that you continue on your current medications as directed. Please refer to the Current Medication list given to you today.  *If you need a refill on your cardiac medications before your next appointment, please call your pharmacy*  Lab Work: None ordered If you have labs (blood work) drawn today and your tests are completely normal, you will receive your results only by: MyChart Message (if you have MyChart) OR A paper copy in the mail If you have any lab test that is abnormal or we need to change your treatment, we will call you to review the results.  Testing/Procedures: None ordered  Follow-Up: At [facility], you and your health needs are our priority.  As part of our continuing mission to provide you with exceptional heart care, our providers are all part of one team.  This team includes your primary Cardiologist (physician) and Advanced Practice Providers or APPs (Physician Assistants and Nurse Practitioners) who all work together to provide you with the care you need, when you need it.  Your next appointment:    As needed   We recommend signing up for the patient portal called MyChart.  Sign up information is provided on this After Visit Summary.  MyChart is used to connect with patients for Virtual Visits (Telemedicine).  Patients are able to view lab/test results, encounter notes, upcoming appointments, etc.  Non-urgent messages can be sent to your provider as well.   To learn more about what you can do with MyChart, go to [email or web address].

## 2024-06-04 NOTE — Progress Notes (Signed)
 Office Visit    Patient Name: Kayla Blake Date of Encounter: 06/04/2024  Primary Care Provider:  Espinoza, Alejandra, DO Primary Cardiologist:  Kardie Tobb, DO  Chief Complaint    58 year old female with a history of palpitations, hypertension, hyperlipidemia, OSA on CPAP, asthma, and anxiety who presents for follow-up related to palpitations.   Past Medical History    Past Medical History:  Diagnosis Date   Alcohol abuse    Allergic rhinitis    Anxiety    Arthritis    GERD (gastroesophageal reflux disease)    Heart murmur    Hyperlipidemia    Hypertension    Hypothyroidism    IBS (irritable bowel syndrome)    Macrocytosis    Uterine fibroids affecting pregnancy    Vitamin D deficiency    Past Surgical History:  Procedure Laterality Date   BREAST EXCISIONAL BIOPSY     CHOLECYSTECTOMY     COLONOSCOPY     ORIF ANKLE FRACTURE Right 05/15/2015   Procedure: OPEN REDUCTION INTERNAL FIXATION (ORIF) ANKLE FRACTURE;  Surgeon: Jerona Harden GAILS, MD;  Location: [facility];  Service: Orthopedics;  Laterality: Right;   RADIOACTIVE SEED GUIDED EXCISIONAL BREAST BIOPSY Right 07/16/2015   Procedure: RADIOACTIVE SEED GUIDED EXCISIONAL BREAST BIOPSY;  Surgeon: Donnice Bury, MD;  Location: [facility];  Service: General;  Laterality: Right;   TUBAL LIGATION      Allergies  No Known Allergies   Labs/Other Studies Reviewed    The following studies were reviewed today:  Cardiac Studies & Procedures   ______________________________________________________________________________________________   STRESS TESTS  ECHOCARDIOGRAM STRESS TEST 12/03/2015  Narrative [facility] [address] [place], [place] [address] [phone]  ------------------------------------------------------------------- Stress Echocardiography  Patient:    Vondra, Aldredge MR #:       [id number] Study Date: 12/03/2015 Gender:     F Age:        49 Height:     165.1  cm Weight:     84.5 kg BSA:        2 m^2 Pt. Status: Room:  TISA Verena Mems [id number] BARTON Verena Mems [id number] SONOGRAPHER  Sherida Lawyer, RDCS ATTENDING    Vinie Maxcy MD PERFORMING   [facility], Outpatient  cc:  -------------------------------------------------------------------  ------------------------------------------------------------------- Indications:      Syncope (R55).  ------------------------------------------------------------------- Study Conclusions  - Stress ECG conclusions: There were no stress arrhythmias or conduction abnormalities. The stress ECG was negative for ischemia. - Staged echo: There was no echocardiographic evidence for stress-induced ischemia. - Impressions: Normal stress echo with appropriate increase in EF from 55% to 70% and decrease in LV cavity size No new RWMAls at good work load and HR 101% PMHR.  Impressions:  - Normal stress echo with appropriate increase in EF from 55% to 70% and decrease in LV cavity size No new RWMAls at good work load and HR 101% PMHR.  Bruce protocol. Stress echocardiography.  Birthdate:  Patient birthdate: 12/20/1965.  Age:  Patient is 58 yr old.  Sex:  Gender: female.    BMI: 31 kg/m^2.  Patient status:  Outpatient.  Study date:  Study date: 12/03/2015. Study time: 07:27 AM.  -------------------------------------------------------------------  ------------------------------------------------------------------- Baseline ECG:  Normal.  ------------------------------------------------------------------- Stress protocol:  +---------------------+---+------------+--------+ Stage                HR BP (mmHg)   Symptoms +---------------------+---+------------+--------+ Baseline  62 135/85 (102)None     +---------------------+---+------------+--------+ Stage 1              100144/74 (97) -------- +---------------------+---+------------+--------+ Stage 2               127155/80 (105)-------- +---------------------+---+------------+--------+ Stage 3              160188/90 (123)-------- +---------------------+---+------------+--------+ Immediate post stress171-------------------- +---------------------+---+------------+--------+ Recovery; 1 min      134192/72 (112)-------- +---------------------+---+------------+--------+ Recovery; 2 min      105-------------------- +---------------------+---+------------+--------+ Recovery; 3 min      102-------------------- +---------------------+---+------------+--------+ Recovery; 5 min      85 148/79 (102)-------- +---------------------+---+------------+--------+ Recovery; 7 min      84 133/81 (98) -------- +---------------------+---+------------+--------+  ------------------------------------------------------------------- Stress results:   Maximal heart rate during stress was 171 bpm (101% of maximal predicted heart rate). The maximal predicted heart rate was 169 bpm.The target heart rate was achieved. The heart rate response to stress was normal. There was a normal resting blood pressure with an appropriate response to stress. The rate-pressure product for the peak heart rate and blood pressure was 69919 mm Hg/min.  The patient experienced no chest pain during stress.  ------------------------------------------------------------------- Stress ECG:  There were no stress arrhythmias or conduction abnormalities.  The stress ECG was negative for ischemia.  ------------------------------------------------------------------- Baseline:  Peak stress:  ------------------------------------------------------------------- Stress echo results:     Left ventricular ejection fraction was normal at rest and with stress. There was no echocardiographic evidence for stress-induced ischemia.  ------------------------------------------------------------------- Prepared and  Electronically Authenticated by  Maude Emmer, M.D. 2017-05-03T09:48:53   ECHOCARDIOGRAM  ECHOCARDIOGRAM COMPLETE 05/09/2024  Narrative ECHOCARDIOGRAM REPORT    Patient Name:   Kayla Blake Date of Exam: 05/09/2024 Medical Rec #:  [id number]       Height:       65.0 in Accession #:    [id number]      Weight:       199.2 lb Date of Birth:  1965-10-08       BSA:          1.975 m Patient Age:    58 years        BP:           136/83 mmHg Patient Gender: F               HR:           53 bpm. Exam Location:  [address]  Procedure: 2D Echo, Cardiac Doppler, Color Doppler, 3D Echo and Strain Analysis (Both Spectral and Color Flow Doppler were utilized during procedure).  Indications:    R06.02 SOB; R60.0 Lower extremity edema  History:        Patient has prior history of Echocardiogram examinations, most recent 06/10/2023. Signs/Symptoms:Murmur; Risk Factors:Hypertension and Dyslipidemia.  Sonographer:    Augustin Seals RDCS Referring Phys: [id number] [facility]  IMPRESSIONS   1. Left ventricular ejection fraction, by estimation, is 60 to 65%. Left ventricular ejection fraction by 3D volume is 61 %. The left ventricle has normal function. The left ventricle has no regional wall motion abnormalities. Left ventricular diastolic parameters were normal. The average left ventricular global longitudinal strain is -22.6 %. The global longitudinal strain is normal. 2. Right ventricular systolic function is normal. The right ventricular size is normal. There is normal pulmonary artery systolic pressure. The estimated right ventricular systolic pressure is 22.5 mmHg. 3. The mitral valve is normal in structure. No evidence of mitral valve regurgitation. No evidence of mitral  stenosis. 4. The aortic valve is tricuspid. Aortic valve regurgitation is not visualized. No aortic stenosis is present. 5. The inferior vena cava is normal in size with greater than 50% respiratory variability,  suggesting right atrial pressure of 3 mmHg.  FINDINGS Left Ventricle: Left ventricular ejection fraction, by estimation, is 60 to 65%. Left ventricular ejection fraction by 3D volume is 61 %. The left ventricle has normal function. The left ventricle has no regional wall motion abnormalities. The average left ventricular global longitudinal strain is -22.6 %. Strain was performed and the global longitudinal strain is normal. The left ventricular internal cavity size was normal in size. There is no left ventricular hypertrophy. Left ventricular diastolic parameters were normal.  Right Ventricle: The right ventricular size is normal. No increase in right ventricular wall thickness. Right ventricular systolic function is normal. There is normal pulmonary artery systolic pressure. The tricuspid regurgitant velocity is 2.21 m/s, and with an assumed right atrial pressure of 3 mmHg, the estimated right ventricular systolic pressure is 22.5 mmHg.  Left Atrium: Left atrial size was normal in size.  Right Atrium: Right atrial size was normal in size.  Pericardium: There is no evidence of pericardial effusion.  Mitral Valve: The mitral valve is normal in structure. No evidence of mitral valve regurgitation. No evidence of mitral valve stenosis.  Tricuspid Valve: The tricuspid valve is normal in structure. Tricuspid valve regurgitation is mild . No evidence of tricuspid stenosis.  Aortic Valve: The aortic valve is tricuspid. Aortic valve regurgitation is not visualized. No aortic stenosis is present.  Pulmonic Valve: The pulmonic valve was normal in structure. Pulmonic valve regurgitation is not visualized. No evidence of pulmonic stenosis.  Aorta: The aortic root is normal in size and structure.  Venous: The inferior vena cava is normal in size with greater than 50% respiratory variability, suggesting right atrial pressure of 3 mmHg.  IAS/Shunts: No atrial level shunt detected by color flow  Doppler.  Additional Comments: 3D was performed not requiring image post processing on an independent workstation and was normal.   LEFT VENTRICLE PLAX 2D LVIDd:         4.30 cm         Diastology LVIDs:         2.50 cm         LV e' medial:    8.70 cm/s LV PW:         0.90 cm         LV E/e' medial:  11.3 LV IVS:        0.90 cm         LV e' lateral:   12.80 cm/s LVOT diam:     2.00 cm         LV E/e' lateral: 7.7 LV SV:         87 LV SV Index:   44              2D Longitudinal LVOT Area:     3.14 cm        Strain 2D Strain GLS   -20.5 % (A4C): 2D Strain GLS   -22.2 % (A3C): 2D Strain GLS   -25.1 % (A2C): 2D Strain GLS   -22.6 % Avg:  3D Volume EF LV 3D EF:    Left ventricul ar ejection fraction by 3D volume is 61 %.  3D Volume EF: 3D EF:        61 % LV EDV:  141 ml LV ESV:       55 ml LV SV:        85 ml  RIGHT VENTRICLE             IVC RV Basal diam:  4.20 cm     IVC diam: 1.70 cm RV Mid diam:    2.80 cm RV S prime:     12.80 cm/s  PULMONARY VEINS TAPSE (M-mode): 2.8 cm      A Reversal Velocity: 36.70 cm/s Diastolic Velocity:  40.10 cm/s S/D Velocity:        1.60 Systolic Velocity:   64.20 cm/s  LEFT ATRIUM             Index        RIGHT ATRIUM           Index LA diam:        3.50 cm 1.77 cm/m   RA Area:     14.30 cm LA Vol (A2C):   40.6 ml 20.56 ml/m  RA Volume:   35.80 ml  18.13 ml/m LA Vol (A4C):   27.4 ml 13.88 ml/m LA Biplane Vol: 34.6 ml 17.52 ml/m AORTIC VALVE LVOT Vmax:   115.00 cm/s LVOT Vmean:  77.000 cm/s LVOT VTI:    0.276 m  AORTA Ao Root diam: 2.80 cm Ao Asc diam:  3.70 cm  MITRAL VALVE               TRICUSPID VALVE MV Area (PHT): 3.42 cm    TR Peak grad:   19.5 mmHg MV Decel Time: 222 msec    TR Vmax:        221.00 cm/s MV E velocity: 98.00 cm/s MV A velocity: 67.10 cm/s  SHUNTS MV E/A ratio:  1.46        Systemic VTI:  0.28 m Systemic Diam: 2.00 cm  Morene Brownie Electronically signed by Morene Brownie Signature Date/Time: 05/09/2024/10:35:01 PM    Final    MONITORS  LONG TERM MONITOR (3-14 DAYS) 04/19/2024  Narrative Patch Wear Time:  4 days and 13 hours (2025-09-10T17:35:50-0400 to 2025-09-15T07:15:48-0400)  Patient had a min HR of 47 bpm, max HR of 132 bpm, and avg HR of 65 bpm. Predominant underlying rhythm was Sinus Rhythm. Isolated SVEs were rare (<1.0%), SVE Couplets were rare (<1.0%), and no SVE Triplets were present. Isolated VEs were rare (<1.0%), and no VE Couplets or VE Triplets were present.  No symptoms reported.  Conclusion: Normal/Unremarkable study.       ______________________________________________________________________________________________     Recent Labs: 08/16/2023: BUN 14; Creatinine, Ser 1.00; Hemoglobin 14.4; Platelets 221.0; Potassium 3.9; Sodium 142; TSH 2.08  Recent Lipid Panel No results found for: CHOL, TRIG, HDL, CHOLHDL, VLDL, LDLCALC, LDLDIRECT  History of Present Illness    58 year old female with the above past medical history including chest pain, palpitations, hypertension, hyperlipidemia, OSA on CPAP, asthma, and anxiety.   She has a history of sleep apnea, on CPAP, followed by pulmonology.  Stress echo in 2017 was normal.  Echocardiogram in 06/2023 showed EF 60 to 65%, normal LV function, no RWMA, normal RV systolic function, no significant valvular abnormalities.  She was last seen in the office on 04/04/2024 and reported a 3 to 46-month history of intermittent palpitations with associated shortness of breath.  Cardiac monitor in 04/2024 revealed predominantly normal sinus rhythm, rare PACs and PVCs, no significant arrhythmia.  Repeat echocardiogram in 05/2024 showed EF 60 to 65%, normal LV function, no RWMA, normal  RV systolic function, mild TR.  She presents today for follow-up.  Since her last visit she has done well from a cardiac standpoint.  She notes a constant soreness in her epigastric region, tender to  palpation. She also reports mild shortness of breath when she lays down to sleep at night, which she attributes to anxiety.  She denies any exertional chest pain or shortness of breath.  She does have nonpitting bilateral lower extremity edema, unchanged from prior visits,  she denies any PND, orthopnea, weight gain. Overall, she reports feeling well.    Home Medications    Current Outpatient Medications  Medication Sig Dispense Refill   albuterol  (VENTOLIN  HFA) 108 (90 Base) MCG/ACT inhaler Inhale 2 puffs into the lungs every 6 (six) hours as needed for wheezing or shortness of breath. 8 g 2   ALPRAZolam (XANAX) 0.25 MG tablet Take 0.25 mg by mouth at bedtime as needed for anxiety or sleep.     amLODipine (NORVASC) 10 MG tablet Take 10 mg by mouth daily.     Eszopiclone  3 MG TABS Take 1 tablet (3 mg total) by mouth at bedtime. Take immediately before bedtime 30 tablet 5   hydrochlorothiazide (HYDRODIURIL) 25 MG tablet Take 25 mg by mouth daily.     Multiple Vitamins-Minerals (MULTIVITAMINS THER. W/MINERALS) TABS tablet Take 1 tablet by mouth daily.     pantoprazole  (PROTONIX ) 40 MG tablet Take 1 tablet by mouth once daily 30 tablet 3   PARoxetine (PAXIL) 40 MG tablet Take 40 mg by mouth daily.     potassium chloride  SA (KLOR-CON  M) 20 MEQ tablet Take 1 tablet (20 mEq total) by mouth 2 (two) times daily for 5 days. 10 tablet 0   rosuvastatin (CRESTOR) 10 MG tablet Take 10 mg by mouth daily.     tirzepatide (ZEPBOUND) 7.5 MG/0.5ML injection vial 7.5 mg Subcutaneous once a week; Duration: 28 days     clobetasol cream (TEMOVATE) 0.05 % Apply 1 application topically as needed. (Patient not taking: Reported on 06/04/2024)     No current facility-administered medications for this visit.     Review of Systems    She denies chest pain, dyspnea, pnd, orthopnea, n, v, dizziness, syncope, weight gain, or early satiety. All other systems reviewed and are otherwise negative except as noted above.    Physical Exam    VS:  BP 126/84 (BP Location: Left Arm, Patient Position: Sitting, Cuff Size: Normal)   Pulse 83   Ht 5' 6 (1.676 m)   Wt 194 lb 9.6 oz (88.3 kg)   LMP 09/03/2015   SpO2 97%   BMI 31.41 kg/m  GEN: Well nourished, well developed, in no acute distress. HEENT: normal. Neck: Supple, no JVD, carotid bruits, or masses. Cardiac: RRR, no murmurs, rubs, or gallops. No clubbing, cyanosis, trace bilateral lower extremity edema.  Radials/DP/PT 2+ and equal bilaterally.  Respiratory:  Respirations regular and unlabored, clear to auscultation bilaterally. GI: Soft, nontender, nondistended, BS + x 4. MS: no deformity or atrophy. Skin: warm and dry, no rash. Neuro:  Strength and sensation are intact. Psych: Normal affect.  Accessory Clinical Findings    ECG personally reviewed by me today -    - no EKG in office today.    Lab Results  Component Value Date   WBC 4.8 08/16/2023   HGB 14.4 08/16/2023   HCT 43.7 08/16/2023   MCV 97.8 08/16/2023   PLT 221.0 08/16/2023   Lab Results  Component Value Date   CREATININE  1.00 08/16/2023   BUN 14 08/16/2023   NA 142 08/16/2023   K 3.9 08/16/2023   CL 105 08/16/2023   CO2 28 08/16/2023   Lab Results  Component Value Date   ALT 27 05/19/2020   AST 28 05/19/2020   ALKPHOS 52 05/19/2020   BILITOT 1.0 05/19/2020   No results found for: CHOL, HDL, LDLCALC, LDLDIRECT, TRIG, CHOLHDL  No results found for: HGBA1C  Assessment & Plan    1. Palpitations/shortness of breath: Cardiac monitor 04/2024 revealed prominent normal sinus rhythm, rare PACs and PVCs, no significant arrhythmia.  Echocardiogram in 05/2024 showed EF 60 to 65%, normal LV function, no RWMA, normal RV systolic function, mild TR.  She denies any significant palpitations.  She reports stable nonpitting bilateral lower extremity edema, likely in the setting of amlodipine use.  She does continue to note mild shortness of breath when she lays down to sleep  at night, she attributes this to her anxiety.  She denies any exertional symptoms concerning for angina. Euvolemic and well compensated on exam.  Continue to monitor for progressive symptoms.  Monitor/echo overall reassuring.  Reviewed triggers, ED precautions.    2. Hypertension: BP well controlled. Continue current antihypertensive regimen.    3. Hyperlipidemia: LDL was 150 in 03/2024. Recently started on rosuvastatin.  Pending repeat labs per PCP.  Continue Crestor.   4. OSA: Adherent to CPAP.  Followed by pulmonology.   5. Disposition: Follow-up as needed with Dr. Sheena.       Damien JAYSON Braver, NP 06/04/2024, 9:35 AM

## 2024-06-25 ENCOUNTER — Other Ambulatory Visit: Payer: Self-pay | Admitting: Pulmonary Disease

## 2024-06-25 NOTE — Telephone Encounter (Signed)
 Pt requesting refill of controlled medication. Please advise, thank you!  LOV: 11/28/23 Last Fill: 05/22/24

## 2024-06-25 NOTE — Telephone Encounter (Signed)
 Copied from CRM #[id number]. Topic: Clinical - Medication Refill >> Jun 25, 2024  2:13 PM Dustin F wrote: Medication: Eszopiclone  3 MG TABS; after this refill the pt wants the refills to got to [facility].  Has the patient contacted their pharmacy? Yes (Agent: If no, request that the patient contact the pharmacy for the refill. If patient does not wish to contact the pharmacy document the reason why and proceed with request.) (Agent: If yes, when and what did the pharmacy advise?)  This is the patient's preferred pharmacy:   [facility] [address] [place], [place] - [address]  [address] [place] [address]  Phone: [phone] Fax: [phone]     Is this the correct pharmacy for this prescription? Yes If no, delete pharmacy and type the correct one.   Has the prescription been filled recently? Yes  Is the patient out of the medication? Yes  Has the patient been seen for an appointment in the last year OR does the patient have an upcoming appointment? Yes  Can we respond through MyChart? Yes  Agent: Please be advised that Rx refills may take up to 3 business days. We ask that you follow-up with your pharmacy.

## 2024-06-25 NOTE — Telephone Encounter (Signed)
 Copied from CRM #[id number]. Topic: Clinical - Prescription Issue >> Jun 25, 2024  2:19 PM Celestine FALCON wrote: Reason for CRM: Pt wanted to relay that going forward, after this month's refill of Eszopiclone  3 MG TABS, she would like the refills to start going to [facility] - [place], [place] - [address] [facility] [address] [place] [address] [place] [address] Phone: [phone] Fax: [phone] Hours: Not open 24 hours  Her latest refill request was sent to [facility] [address] [place], [place] - [address] [place] [address] Phone: [phone] Fax: [phone] Hours: Not open 24 hours per the pt's request.  Pt's phone number is [phone].

## 2024-06-26 MED ORDER — ESZOPICLONE 3 MG PO TABS: 3.0000 mg | ORAL_TABLET | Freq: Every day | ORAL | 5 refills | Status: DC

## 2024-08-06 ENCOUNTER — Other Ambulatory Visit: Payer: Self-pay | Admitting: Pulmonary Disease

## 2024-08-06 NOTE — Telephone Encounter (Signed)
 Copied from CRM [id number]. Topic: Clinical - Medication Refill >> Aug 06, 2024 11:00 AM Rilla B wrote: Medication: Eszopiclone  3 MG TABS  Has the patient contacted their pharmacy? Yes (Agent: If no, request that the patient contact the pharmacy for the refill. If patient does not wish to contact the pharmacy document the reason why and proceed with request.) (Agent: If yes, when and what did the pharmacy advise?)  This is the patient's preferred pharmacy:  [facility] [address] [place], [place] - [address] [place] [address] Phone: [phone] Fax: [phone]   Is this the correct pharmacy for this prescription? Yes If no, delete pharmacy and type the correct one.   Has the prescription been filled recently? Yes  Is the patient out of the medication? Yes  Has the patient been seen for an appointment in the last year OR does the patient have an upcoming appointment? Yes  Can we respond through MyChart? Yes  Agent: Please be advised that Rx refills may take up to 3 business days. We ask that you follow-up with your pharmacy.

## 2024-08-06 NOTE — Telephone Encounter (Signed)
 Copied from CRM [id number]. Topic: Clinical - Prescription Issue >> Aug 06, 2024 11:04 AM Rilla NOVAK wrote: Reason for CRM: Patient would like a new prescription for Eszopiclone  3MG  tablet sent to:  [facility] - [place], [place] - [address] [facility] [address] [place] [address] Phone: [phone] Fax: [phone]  *Patient currently out of the medication and I did a refill to local pharmacy.  Moving forward, patient needs this script to be fill by [facility].   Please advise Dr. Neda  LOV 11/28/2023

## 2024-08-08 MED ORDER — ESZOPICLONE 3 MG PO TABS: 3.0000 mg | ORAL_TABLET | Freq: Every day | ORAL | 5 refills | Status: AC

## 2024-08-08 NOTE — Telephone Encounter (Signed)
 Pt requesting refill of eszopiclone . Please advise, thank you!  LOV: 11/28/23 Last Fill: 06/26/24

## 2024-08-23 ENCOUNTER — Telehealth: Payer: Self-pay

## 2024-08-23 NOTE — Telephone Encounter (Unsigned)
 Copied from CRM #[id number]. Topic: Clinical - Medication Refill >> Aug 22, 2024  4:22 PM Rilla B wrote: Medication: Eszopiclone  3 MG TABS  Has the patient contacted their pharmacy? Yes (Agent: If no, request that the patient contact the pharmacy for the refill. If patient does not wish to contact the pharmacy document the reason why and proceed with request.) (Agent: If yes, when and what did the pharmacy advise?)  This is the patient's preferred pharmacy:  [facility] - [place], [place] - [address] [facility] [address] [place] [address] [place] [address] Phone: [phone] Fax: [phone]  Is this the correct pharmacy for this prescription? Yes If no, delete pharmacy and type the correct one.   Has the prescription been filled recently? Yes  Is the patient out of the medication? No  Has the patient been seen for an appointment in the last year OR does the patient have an upcoming appointment? Yes  Can we respond through MyChart? Yes  Agent: Please be advised that Rx refills may take up to 3 business days. We ask that you follow-up with your pharmacy.
